# Patient Record
Sex: Female | Born: 1990 | Hispanic: Yes | Smoking: Never smoker
Health system: Southern US, Community
[De-identification: ages and names within clinical notes are randomized; demographics above are authoritative.]

## PROBLEM LIST (undated history)

## (undated) DIAGNOSIS — D649 Anemia, unspecified: Secondary | ICD-10-CM

---
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
## 2017-03-21 ENCOUNTER — Other Ambulatory Visit: Payer: Self-pay | Admitting: Occupational Medicine

## 2017-03-21 ENCOUNTER — Ambulatory Visit: Payer: Self-pay

## 2017-03-21 DIAGNOSIS — M25511 Pain in right shoulder: Secondary | ICD-10-CM

## 2017-04-07 ENCOUNTER — Other Ambulatory Visit: Payer: Self-pay | Admitting: Orthopedic Surgery

## 2017-04-07 DIAGNOSIS — S4381XS Sprain of other specified parts of right shoulder girdle, sequela: Secondary | ICD-10-CM

## 2017-04-07 DIAGNOSIS — M25512 Pain in left shoulder: Secondary | ICD-10-CM

## 2017-04-20 ENCOUNTER — Ambulatory Visit
Admission: RE | Admit: 2017-04-20 | Discharge: 2017-04-20 | Disposition: A | Discharge status: Home / Self Care | Payer: PRIVATE HEALTH INSURANCE | Source: Ambulatory Visit | Attending: Orthopedic Surgery | Admitting: Orthopedic Surgery

## 2017-04-20 DIAGNOSIS — S4381XA Sprain of other specified parts of right shoulder girdle, initial encounter: Secondary | ICD-10-CM | POA: Insufficient documentation

## 2017-04-20 DIAGNOSIS — S4381XS Sprain of other specified parts of right shoulder girdle, sequela: Secondary | ICD-10-CM

## 2017-04-20 DIAGNOSIS — M25512 Pain in left shoulder: Secondary | ICD-10-CM

## 2017-04-20 DIAGNOSIS — M25511 Pain in right shoulder: Secondary | ICD-10-CM | POA: Diagnosis not present

## 2017-05-10 ENCOUNTER — Ambulatory Visit: Payer: PRIVATE HEALTH INSURANCE

## 2017-05-26 ENCOUNTER — Ambulatory Visit: Payer: PRIVATE HEALTH INSURANCE | Admitting: Physical Therapy

## 2017-05-30 ENCOUNTER — Ambulatory Visit: Payer: PRIVATE HEALTH INSURANCE | Admitting: Physical Therapy

## 2017-05-31 ENCOUNTER — Encounter: Payer: Self-pay | Admitting: Physical Therapy

## 2017-06-06 ENCOUNTER — Encounter: Payer: PRIVATE HEALTH INSURANCE | Admitting: Physical Therapy

## 2017-06-06 ENCOUNTER — Ambulatory Visit: Payer: PRIVATE HEALTH INSURANCE | Attending: Orthopedic Surgery | Admitting: Physical Therapy

## 2017-06-06 DIAGNOSIS — M6281 Muscle weakness (generalized): Secondary | ICD-10-CM | POA: Insufficient documentation

## 2017-06-06 DIAGNOSIS — M25511 Pain in right shoulder: Secondary | ICD-10-CM | POA: Insufficient documentation

## 2017-06-06 DIAGNOSIS — G8929 Other chronic pain: Secondary | ICD-10-CM | POA: Insufficient documentation

## 2017-06-06 NOTE — Therapy (Signed)
[facility] [address] [place], [address] Phone: [phone]   Fax:  [phone]  Physical Therapy Evaluation  Patient Details  Name: Bonnie Williams MRN: [id number] Date of Birth: 1991-01-19 Referring Provider: Dr. Gean Birchwood  Encounter Date: 06/06/2017      PT End of Session - 06/06/17 0901    Visit Number 1   Number of Visits 9   Date for PT Re-Evaluation 07/18/17   PT Start Time 0809   PT Stop Time 0859   PT Time Calculation (min) 50 min   Activity Tolerance Patient tolerated treatment well   Behavior During Therapy [facility] for tasks assessed/performed      No past medical history on file.  No past surgical history on file.  There were no vitals filed for this visit.       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 She has had imaging done which she reports shows no tears but significant swelling. Initially she heard 2 separate 2 pops, felt a warming sensation in her R upper trap area, felt an increasing numbing sensation throughout her R arm. She was in a sling for a few weeks, which she reports if anything made it worse. Right now she can lift her arm to write on the white boards once or twice without pain, however with repetition she reports feeling more pain. She has not tried mobilizing a patient since, as she has hesitancy. Most of her discomfort now is with repetitive overhead movements, and upper trap area in the night.    Limitations Lifting  Pulling, sleeping   Diagnostic tests MRI - mild tendinosis of the supraspinatus    Patient Stated Goals Get as strong as possible with as minimal pain as possible.    Currently in Pain? No/denies            [facility] PT Assessment -  06/06/17 1434      Assessment   Medical Diagnosis R shoulder pain   Referring Provider Dr. Gean Birchwood     Precautions   Precautions None     Restrictions   Weight Bearing Restrictions No     Balance Screen   Has the patient fallen in the past 6 months No     Prior Function   Level of Independence Independent     Cognition   Overall Cognitive Status Within Functional Limits for tasks assessed      R grip strength in pounds  48 53 45  L grip strength in pounds  45 60 40#  R flexion 136 degrees (discomfort)  Abduction - 145 degrees (discomfort)  L flexion - 165 degrees  Abduction - 171 degrees   R rotation very mildly limited and reproductive of her pain in R upper trap  Cervical retraction, extension, flexion, side bending, L rotation all WNL and pain free   R passive ER limited, guarding and painful IR - WNL , passive flexion guarding full motion but guarded   MMT  4-/5 in IR, ER, abdcution secondary to pain on R, 5/5 on L  Traction - painful per patient  Grade I-II mobilizations performed over thoracic into cervical spine. Her areas of most discomfort were roughly T5-7 through T1, decreasing throughout the cervical  spine. She reports feeling of stiffness and heaviness while completing. 3 bouts x 30" performed throughout the thoracic spine with feeling of stiffness/achiness reported.   Soft tissue mobilization performed over R rhomboids and paraspinals into lower trapezius as well. Patient reported feeling less stiffness around this spot to complete session. Performed follow up gradual passive ER through tight sensation range but not pain x 15" x 3 bouts.   After tx - 145 degrees actively with less pain reported.        Objective measurements completed on examination: See above findings.                  PT Education - 06/06/17 0900    Education provided Yes   Education Details Her discomfort appears to be related to residual soft  tissue/joint related dysfunction. Will progress to strengthening activities when her pain levels decrease.    Person(s) Educated Patient   Methods Explanation;Demonstration   Comprehension Verbalized understanding;Returned demonstration             PT Long Term Goals - 06/06/17 1302      PT LONG TERM GOAL #1   Title Patient will demonstrate at least 160 degrees of active shoulder flexion with no increase in pain to return to work related duties.    Time 6   Period Weeks   Status New     PT LONG TERM GOAL #2   Title Patient will report QuickDash score of less than 10% disability to demonstrate improved tolerance for ADLs.    Time 6   Period Weeks   Status New     PT LONG TERM GOAL #3   Title Patient will complete 1 week of full work duties with no increase in pain to complete ADLs.    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 Imaging revealed no tears of the RTC and she is able to actively contract all RTC muscles, though painful. Her active and passive ROM are limited by pain currently, which appears to at least in part be contributed by soft tissue dysfunction. She also notes pain with R rotation and traction indicative of possible cervical involvement, as pain is largely centered around UT and biceps head interval in her shoulder. She would benefit from skilled PT services to address her pain and reduced function for full return to daily activities.    Clinical Presentation Stable   Clinical Decision Making Moderate   Rehab Potential Excellent   Clinical Impairments Affecting Rehab Potential Young age, motivated, good ROM and no imaging findings of tears.    PT Frequency 2x / week   PT Duration 4 weeks   PT Treatment/Interventions Aquatic Therapy;ADLs/Self Care Home Management;Electrical  Stimulation;Dry needling;Manual techniques;Neuromuscular re-education;Therapeutic exercise;Therapeutic activities;Balance training;Taping;Moist Heat;Traction;Iontophoresis 4mg /ml Dexamethasone;Cryotherapy   PT Next Visit Plan Progress soft tissue techniques for pain relief.    PT Home Exercise Plan Will provide in follow up session.    Consulted and Agree with Plan of Care Patient      Patient will benefit from skilled therapeutic intervention in order to improve the following deficits and impairments:     Visit Diagnosis: Chronic right shoulder pain - Plan: PT plan of care cert/re-cert  Muscle weakness (generalized) -  Plan: PT plan of care cert/re-cert     Problem List There are no active problems to display for this patient.  Alva GarnetPatrick Kowen Kluth PT, DPT, CSCS    06/06/2017, 2:36 PM  [facility] [address] [place], [place], [address] Phone: [phone]   Fax:  [phone]  Name: Ronnald Collumvelia Martinez Benitez MRN: [id number] Date of Birth: [birth year]

## 2017-06-06 NOTE — Patient Instructions (Addendum)
R 48 53 45  L 45 60 40#  R flexion 136 degrees  Abduction - 145 degrees (discomfort)  L flexion - 165 degrees  Abduction - 171 degrees   R rotation very mildly limited and reproductive of her pain in R upper trap  Cervical retraction, extension, flexion, side bending, L rotation all WNL and pain free   R passive ER limited, guarding and painful IR - WNL , passive flexion guarding full motion but guarded   MMT  4-/5 in IR, ER, abdcution secondary to pain on R, 5/5 on L  Traction - painful per patient    After tx - 145 degrees actively with less

## 2017-06-09 ENCOUNTER — Ambulatory Visit: Payer: PRIVATE HEALTH INSURANCE | Admitting: Physical Therapy

## 2017-06-09 DIAGNOSIS — M6281 Muscle weakness (generalized): Secondary | ICD-10-CM

## 2017-06-09 DIAGNOSIS — G8929 Other chronic pain: Secondary | ICD-10-CM

## 2017-06-09 DIAGNOSIS — M25511 Pain in right shoulder: Principal | ICD-10-CM

## 2017-06-15 ENCOUNTER — Ambulatory Visit: Payer: PRIVATE HEALTH INSURANCE | Admitting: Physical Therapy

## 2017-06-15 DIAGNOSIS — G8929 Other chronic pain: Secondary | ICD-10-CM

## 2017-06-15 DIAGNOSIS — M25511 Pain in right shoulder: Principal | ICD-10-CM

## 2017-06-15 DIAGNOSIS — M6281 Muscle weakness (generalized): Secondary | ICD-10-CM

## 2017-06-15 NOTE — Patient Instructions (Signed)
AROM to 160 degrees

## 2017-06-15 NOTE — Therapy (Deleted)
[facility] [address], [place], [address] Phone: [phone]   Fax:  [phone]  Physical Therapy Evaluation  Patient Details  Name: Bonnie Williams MRN: [id number] Date of Birth: 1991-10-19 Referring Provider: Dr. Gean Birchwood  Encounter Date: 06/09/2017      PT End of Session - 06/15/17 1249    Visit Number 2   Number of Visits 9   Date for PT Re-Evaluation 07/18/17   PT Start Time 1745   PT Stop Time 1830   PT Time Calculation (min) 45 min   Activity Tolerance Patient tolerated treatment well   Behavior During Therapy [facility] for tasks assessed/performed      No past medical history on file.  No past surgical history on file.  There were no vitals filed for this visit.       Subjective Assessment - 06/15/17 1248    Subjective Patient reports she has continued to have some pain in her shoulder and neck area, but that this has reduced after brief increase after manual therapy performed in evaluation.    Limitations Lifting  Pulling, sleeping   Diagnostic tests MRI - mild tendinosis of the supraspinatus    Patient Stated Goals Get as strong as possible with as minimal pain as possible.    Currently in Pain? Other (Comment)  Soreness with overhead motion or moving her arm, but less so than her baseline.                 Objective measurements completed on examination: See above findings.                  PT Education - 06/15/17 1249    Education provided Yes   Education Details Will continue to focus on pain control then work on ROM and strength for return to function.    Person(s) Educated Patient   Methods Demonstration;Explanation   Comprehension Verbalized understanding;Returned demonstration             PT Long Term Goals - 06/06/17 1302      PT LONG TERM GOAL #1   Title Patient will demonstrate at least 160 degrees of active shoulder flexion with  no increase in pain to return to work related duties.    Time 6   Period Weeks   Status New     PT LONG TERM GOAL #2   Title Patient will report QuickDash score of less than 10% disability to demonstrate improved tolerance for ADLs.    Time 6   Period Weeks   Status New     PT LONG TERM GOAL #3   Title Patient will complete 1 week of full work duties with no increase in pain to complete ADLs.    Time 6   Period Weeks   Status New                Plan - 06/15/17 1249    Clinical Impression Statement Patient is initially quite sore with soft tissue muscle pains, but appeared to find relief and increased her AROM without as much discomfort after soft tissue pain control modalities applied. WIll begin strengthening and ROM phase as tolerated when her resting and active motion pain are decreased.    Clinical Presentation Stable   Clinical Decision Making Moderate   Rehab Potential Excellent   Clinical Impairments Affecting Rehab Potential Young age, motivated, good ROM and no imaging findings of tears.    PT  Frequency 2x / week   PT Duration 4 weeks   PT Treatment/Interventions Aquatic Therapy;ADLs/Self Care Home Management;Electrical Stimulation;Dry needling;Manual techniques;Neuromuscular re-education;Therapeutic exercise;Therapeutic activities;Balance training;Taping;Moist Heat;Traction;Iontophoresis 4mg /ml Dexamethasone;Cryotherapy   PT Next Visit Plan Progress soft tissue techniques for pain relief.    PT Home Exercise Plan Will provide in follow up session.    Consulted and Agree with Plan of Care Patient      Patient will benefit from skilled therapeutic intervention in order to improve the following deficits and impairments:  Pain, Impaired UE functional use, Decreased strength, Decreased range of motion  Visit Diagnosis: Chronic right shoulder pain  Muscle weakness (generalized)     Problem List There are no active problems to display for this  patient.   Alva Garnetatrick McNamara 06/15/2017, 1:01 PM  [place] [facility] [address] [place], [place], [address] Phone: [phone]   Fax:  [phone]  Name: Bonnie Williams MRN: [id number] Date of Birth: 11/15/1991

## 2017-06-15 NOTE — Therapy (Signed)
[facility] [address] [place], [place], [address] Phone: [phone]   Fax:  [phone]  Physical Therapy Treatment  Patient Details  Name: Bonnie Collumvelia Martinez Williams MRN: [id number] Date of Birth: 27-Aug-1991 Referring Provider: Dr. Gean BirchwoodFrank Rowan  Encounter Date: 06/09/2017      PT End of Session - 06/15/17 1249    Visit Number 2   Number of Visits 9   Date for PT Re-Evaluation 07/18/17   PT Start Time 1745   PT Stop Time 1830   PT Time Calculation (min) 45 min   Activity Tolerance Patient tolerated treatment well   Behavior During Therapy [facility] for tasks assessed/performed      No past medical history on file.  No past surgical history on file.  There were no vitals filed for this visit.      Subjective Assessment - 06/15/17 1248    Subjective Patient reports she has continued to have some pain in her shoulder and neck area, but that this has reduced after brief increase after manual therapy performed in evaluation.    Limitations Lifting  Pulling, sleeping   Diagnostic tests MRI - mild tendinosis of the supraspinatus    Patient Stated Goals Get as strong as possible with as minimal pain as possible.    Currently in Pain? Other (Comment)  Soreness with overhead motion or moving her arm, but less so than her baseline.       Hot pack and high volt stim applied to infraspinatus and upper trapezius on her R side (75V x 15 minutes) with reported decrease in tightness and discomfort she was feeling   Soft tissue mobilization around infraspinatus, rhomboids, levator scapulae, and upper trapezius, patient was tender in all areas, especially levator scapulae and upper trapezius likely due to compensatory movement patterns to avoid use of supraspinatus. Patient was able to complete forward elevation to roughly 145-150 degrees with less pain noted during elevation than when performed initially in this session.                             PT Education - 06/15/17 1249    Education provided Yes   Education Details Will continue to focus on pain control then work on ROM and strength for return to function.    Person(s) Educated Patient   Methods Demonstration;Explanation   Comprehension Verbalized understanding;Returned demonstration             PT Long Term Goals - 06/06/17 1302      PT LONG TERM GOAL #1   Title Patient will demonstrate at least 160 degrees of active shoulder flexion with no increase in pain to return to work related duties.    Time 6   Period Weeks   Status New     PT LONG TERM GOAL #2   Title Patient will report QuickDash score of less than 10% disability to demonstrate improved tolerance for ADLs.    Time 6   Period Weeks   Status New     PT LONG TERM GOAL #3   Title Patient will complete 1 week of full work duties with no increase in pain to complete ADLs.    Time 6   Period Weeks   Status New               Plan - 06/15/17 1249    Clinical Impression Statement Patient is initially quite sore with  soft tissue muscle pains, but appeared to find relief and increased her AROM without as much discomfort after soft tissue pain control modalities applied. WIll begin strengthening and ROM phase as tolerated when her resting and active motion pain are decreased.    Clinical Presentation Stable   Clinical Decision Making Moderate   Rehab Potential Excellent   Clinical Impairments Affecting Rehab Potential Young age, motivated, good ROM and no imaging findings of tears.    PT Frequency 2x / week   PT Duration 4 weeks   PT Treatment/Interventions Aquatic Therapy;ADLs/Self Care Home Management;Electrical Stimulation;Dry needling;Manual techniques;Neuromuscular re-education;Therapeutic exercise;Therapeutic activities;Balance training;Taping;Moist Heat;Traction;Iontophoresis 4mg /ml Dexamethasone;Cryotherapy   PT Next Visit Plan Progress soft  tissue techniques for pain relief.    PT Home Exercise Plan Will provide in follow up session.    Consulted and Agree with Plan of Care Patient      Patient will benefit from skilled therapeutic intervention in order to improve the following deficits and impairments:  Pain, Impaired UE functional use, Decreased strength, Decreased range of motion  Visit Diagnosis: Chronic right shoulder pain  Muscle weakness (generalized)     Problem List There are no active problems to display for this patient.  Alva Garnet PT, DPT, CSCS    06/15/2017, 1:01 PM  [place] [place] [facility] PHYSICAL AND SPORTS MEDICINE [address], [place], [address] Phone: [phone]   Fax:  [phone]  Name: Bonnie Williams MRN: [id number] Date of Birth: 10-08-1991

## 2017-06-16 ENCOUNTER — Ambulatory Visit: Payer: PRIVATE HEALTH INSURANCE | Admitting: Physical Therapy

## 2017-06-16 DIAGNOSIS — M25511 Pain in right shoulder: Secondary | ICD-10-CM | POA: Diagnosis not present

## 2017-06-16 DIAGNOSIS — G8929 Other chronic pain: Secondary | ICD-10-CM

## 2017-06-16 DIAGNOSIS — M6281 Muscle weakness (generalized): Secondary | ICD-10-CM

## 2017-06-16 NOTE — Therapy (Signed)
[place] [facility] [address] [place], [place], [address] Phone: [phone]   Fax:  [phone]  Physical Therapy Treatment  Patient Details  Name: Ronnald Collumvelia Martinez Benitez MRN: [id number] Date of Birth: [birth year] Referring Provider: Dr. Gean BirchwoodFrank Rowan  Encounter Date: 06/15/2017      PT End of Session - 06/16/17 1230    Visit Number 3   Number of Visits 9   Date for PT Re-Evaluation 07/18/17   PT Start Time 1530   PT Stop Time 1600   PT Time Calculation (min) 30 min   Activity Tolerance Patient tolerated treatment well   Behavior During Therapy [facility] for tasks assessed/performed      No past medical history on file.  No past surgical history on file.  There were no vitals filed for this visit.      Subjective Assessment - 06/15/17 1534    Subjective Patient reports her pain levels are much decreased from previous visits, though she gets more pain after working several shifts in a row.    Limitations Lifting  Pulling, sleeping   Diagnostic tests MRI - mild tendinosis of the supraspinatus    Patient Stated Goals Get as strong as possible with as minimal pain as possible.    Currently in Pain? Other (Comment)  Just soreness with overhead motion      AROM to 160 degrees   Sidelying ER 1# for 12 repetitions for 3 sets with towel roll between rib cage and elbow   Prone horizontal abduction with 1# x 8 repetitions for 3 sets   Scaption with 1# DB x 7 repetitions for 2 sets   Performed soft tissue mobilization over levator scapulae and upper trapezius where she was indicating residual soreness from over the weekend. She reports decrease in pain to complete session.                            PT Education - 06/15/17 1534    Education provided Yes   Education Details Will begin strengthening exercises as her motion and pain are normalizing    Person(s) Educated Patient   Methods  Explanation;Demonstration;Handout   Comprehension Verbalized understanding;Returned demonstration             PT Long Term Goals - 06/06/17 1302      PT LONG TERM GOAL #1   Title Patient will demonstrate at least 160 degrees of active shoulder flexion with no increase in pain to return to work related duties.    Time 6   Period Weeks   Status New     PT LONG TERM GOAL #2   Title Patient will report QuickDash score of less than 10% disability to demonstrate improved tolerance for ADLs.    Time 6   Period Weeks   Status New     PT LONG TERM GOAL #3   Title Patient will complete 1 week of full work duties with no increase in pain to complete ADLs.    Time 6   Period Weeks   Status New               Plan - 06/16/17 1231    Clinical Impression Statement Patient has made excellent progress with AROM and resting pain levels. She still has some altered kinematics with OH motion leading to residual discomfort in the UT, however this will be addressed with targeted strengthening.    Clinical Presentation  Stable   Clinical Decision Making Low   Rehab Potential Excellent   Clinical Impairments Affecting Rehab Potential Young age, motivated, good ROM and no imaging findings of tears.    PT Frequency 2x / week   PT Duration 4 weeks   PT Treatment/Interventions Aquatic Therapy;ADLs/Self Care Home Management;Electrical Stimulation;Dry needling;Manual techniques;Neuromuscular re-education;Therapeutic exercise;Therapeutic activities;Balance training;Taping;Moist Heat;Traction;Iontophoresis 4mg /ml Dexamethasone;Cryotherapy   PT Next Visit Plan Progress soft tissue techniques for pain relief.    PT Home Exercise Plan Will provide in follow up session.    Consulted and Agree with Plan of Care Patient      Patient will benefit from skilled therapeutic intervention in order to improve the following deficits and impairments:  Pain, Impaired UE functional use, Decreased strength,  Decreased range of motion  Visit Diagnosis: Chronic right shoulder pain  Muscle weakness (generalized)     Problem List There are no active problems to display for this patient.  Alva Garnet PT, DPT, CSCS    06/16/2017, 12:32 PM  [place] [facility] SP[facility]TS MEDICINE [address], [place], [address] Phone: [phone]   Fax:  [phone]  Name: Vera Furniss MRN: [id number] Date of Birth: 11/19/1991

## 2017-06-16 NOTE — Therapy (Signed)
[facility] [address], [place], [address] Phone: [phone]   Fax:  [phone]  Physical Therapy Treatment  Patient Details  Name: Bonnie Williams MRN: [id number] Date of Birth: [birth year] Referring Provider: Dr. Frederik Pear  Encounter Date: 06/16/2017      PT End of Session - 06/16/17 1450    Visit Number 4   Number of Visits 9   Date for PT Re-Evaluation 07/18/17   PT Start Time 2947   PT Stop Time 1440   PT Time Calculation (min) 20 min   Activity Tolerance Patient tolerated treatment well   Behavior During Therapy [facility] for tasks assessed/performed      No past medical history on file.  No past surgical history on file.  There were no vitals filed for this visit.      Subjective Assessment - 06/16/17 1424    Subjective Patient reports she is feeling much better and aside from some soreness after swimming the other day.    Limitations Lifting  Pulling, sleeping   Diagnostic tests MRI - mild tendinosis of the supraspinatus    Patient Stated Goals Get as strong as possible with as minimal pain as possible.    Currently in Pain? No/denies      Low rows with green t-band x 15 repetitions with appropriate technique   Prone horizontal abduction with 1# DB -- able to complete set up and technique properly x 8 repetitions   Bilateral ERs with red t-band (added towel roll underneath her armpit) -- initially struggled to keep elbow at her side, with towel roll able to complete properly x 12 repetitions   Bear hugs with green t-band x 15 reptitions -- appropriate technique                            PT Education - 06/16/17 1450    Education provided Yes   Education Details provided HEP and discharge instructions    Person(s) Educated Patient   Methods Explanation;Demonstration;Handout   Comprehension Returned demonstration;Verbalized understanding              PT Long Term Goals - 06/16/17 1452      PT LONG TERM GOAL #1   Title Patient will demonstrate at least 160 degrees of active shoulder flexion with no increase in pain to return to work related duties.    Time 6   Period Weeks   Status Achieved     PT LONG TERM GOAL #2   Title Patient will report QuickDash score of less than 10% disability to demonstrate improved tolerance for ADLs.    Time 6   Period Weeks   Status On-going     PT LONG TERM GOAL #3   Title Patient will complete 1 week of full work duties with no increase in pain to complete ADLs.    Time 6   Period Weeks   Status Partially Met               Plan - 06/16/17 1450    Clinical Impression Statement Patient has no further complaints, she was provided with an HEP to address typical areas of weakness/deficits associated with shoulder pain. She will be discharged from formal PT services.    Clinical Presentation Stable   Clinical Decision Making Low   Rehab Potential Excellent   Clinical Impairments Affecting Rehab Potential Young age, motivated, good ROM and no  imaging findings of tears.    PT Frequency 2x / week   PT Duration 4 weeks   PT Treatment/Interventions Aquatic Therapy;ADLs/Self Care Home Management;Electrical Stimulation;Dry needling;Manual techniques;Neuromuscular re-education;Therapeutic exercise;Therapeutic activities;Balance training;Taping;Moist Heat;Traction;Iontophoresis 93m/ml Dexamethasone;Cryotherapy   PT Next Visit Plan Progress soft tissue techniques for pain relief.    PT Home Exercise Plan Will provide in follow up session.    Consulted and Agree with Plan of Care Patient      Patient will benefit from skilled therapeutic intervention in order to improve the following deficits and impairments:  Pain, Impaired UE functional use, Decreased strength, Decreased range of motion  Visit Diagnosis: Chronic right shoulder pain  Muscle weakness (generalized)     Problem  List There are no active problems to display for this patient.  PRoyce MacadamiaPT, DPT, CSCS    06/16/2017, 2:52 PM  [facility] [address] [place] [address]Phone: [phone]  Fax:  [phone] Name: EZanasia HicksonMRN: [id number] of Birth: [birth year]

## 2017-06-23 ENCOUNTER — Encounter: Payer: PRIVATE HEALTH INSURANCE | Admitting: Physical Therapy

## 2019-12-07 NOTE — L&D Delivery Note (Signed)
Date of delivery: 10/25/2020 Estimated Date of Delivery: 11/11/20 Patient's last menstrual period was 02/05/2020. EGA: [redacted]w[redacted]d  Delivery Note At 12:12 AM a viable female was delivered via Vaginal, Spontaneous (Presentation: Left Occiput Anterior).  APGAR: 8, 9; weight pending skin to skin.   Placenta status: Spontaneous, Intact.  Cord: 3 vessels with the following complications: None.  Cord pH: not collected  Anesthesia: Epidural Episiotomy: None Lacerations: 1st degree Suture Repair: 3.0 vicryl rapide Est. Blood Loss (mL): 670cc  Mom presented to L&D with SROM, augmented with pitocin and AROM of forebag.  epidual placed. Progressed to complete, second stage: <10 minutes. Delivery of fetal head with restitution to LOT.   Anterior then posterior shoulders delivered without difficulty.  Baby placed on mom's chest, and attended to by peds.   Cord was then clamped and cut when pulseless by FOB.  Placenta spontaneously delivered, intact.   IV pitocin given for hemorrhage prophylaxis. Thin, brisk bleeding in bursts, methergine IM given.  Shallow 1st degree repaired with 3-0 vicryl rapide in typical fashion.   We sang happy birthday to baby [place].   Mom to postpartum.  Baby to Couplet care / Skin to Skin.  Vantasia Pinkney C Robel Wuertz 10/25/2020, 12:42 AM

## 2020-04-07 ENCOUNTER — Other Ambulatory Visit: Payer: Self-pay

## 2020-04-07 ENCOUNTER — Emergency Department: Payer: Medicaid Other

## 2020-04-07 ENCOUNTER — Encounter: Payer: Self-pay | Admitting: Emergency Medicine

## 2020-04-07 ENCOUNTER — Emergency Department
Admission: EM | Admit: 2020-04-07 | Discharge: 2020-04-07 | Disposition: A | Discharge status: Home / Self Care | Payer: Medicaid Other | Attending: Student in an Organized Health Care Education/Training Program | Admitting: Student in an Organized Health Care Education/Training Program

## 2020-04-07 DIAGNOSIS — R112 Nausea with vomiting, unspecified: Secondary | ICD-10-CM

## 2020-04-07 DIAGNOSIS — O209 Hemorrhage in early pregnancy, unspecified: Secondary | ICD-10-CM | POA: Insufficient documentation

## 2020-04-07 DIAGNOSIS — Z3491 Encounter for supervision of normal pregnancy, unspecified, first trimester: Secondary | ICD-10-CM | POA: Insufficient documentation

## 2020-04-07 DIAGNOSIS — Z3A09 9 weeks gestation of pregnancy: Secondary | ICD-10-CM | POA: Diagnosis not present

## 2020-04-07 DIAGNOSIS — O219 Vomiting of pregnancy, unspecified: Secondary | ICD-10-CM | POA: Diagnosis present

## 2020-04-07 HISTORY — DX: Anemia, unspecified: D64.9

## 2020-04-07 LAB — BASIC METABOLIC PANEL
Anion gap: 10 (ref 5–15)
BUN: 8 mg/dL (ref 6–20)
CO2: 25 mmol/L (ref 22–32)
Calcium: 9.2 mg/dL (ref 8.9–10.3)
Chloride: 100 mmol/L (ref 98–111)
Creatinine, Ser: 0.41 mg/dL — ABNORMAL LOW (ref 0.44–1.00)
GFR calc Af Amer: >60 mL/min (ref >60)
GFR calc non Af Amer: >60 mL/min (ref >60)
Glucose, Bld: 95 mg/dL (ref 70–99)
Potassium: 3.3 mmol/L — ABNORMAL LOW (ref 3.5–5.1)
Sodium: 135 mmol/L (ref 135–145)

## 2020-04-07 LAB — CBC
HCT: 39.5 % (ref 36.0–46.0)
Hemoglobin: 13.6 g/dL (ref 12.0–15.0)
MCH: 29.3 pg (ref 26.0–34.0)
MCHC: 34.4 g/dL (ref 30.0–36.0)
MCV: 85.1 fL (ref 80.0–100.0)
Platelets: 333 10*3/uL (ref 150–400)
RBC: 4.64 MIL/uL (ref 3.87–5.11)
RDW: 11.7 % (ref 11.5–15.5)
WBC: 11.1 10*3/uL — ABNORMAL HIGH (ref 4.0–10.5)
nRBC: 0 % (ref 0.0–0.2)

## 2020-04-07 LAB — HEPATIC FUNCTION PANEL
ALT: 21 U/L (ref 0–44)
AST: 19 U/L (ref 15–41)
Albumin: 4.3 g/dL (ref 3.5–5.0)
Alkaline Phosphatase: 65 U/L (ref 38–126)
Bilirubin, Direct: <0.1 mg/dL (ref 0.0–0.2)
Total Bilirubin: 0.5 mg/dL (ref 0.3–1.2)
Total Protein: 8.3 g/dL — ABNORMAL HIGH (ref 6.5–8.1)

## 2020-04-07 LAB — HCG, QUANTITATIVE, PREGNANCY: hCG, Beta Chain, Quant, S: 199257 m[IU]/mL — ABNORMAL HIGH (ref <5)

## 2020-04-07 LAB — LIPASE, BLOOD: Lipase: 23 U/L (ref 11–51)

## 2020-04-07 MED ORDER — SODIUM CHLORIDE 0.9 % IV BOLUS
1000.0000 mL | Freq: Once | INTRAVENOUS | Status: AC
  Administered 2020-04-07: 14:00:00 1000 mL via INTRAVENOUS

## 2020-04-07 MED ORDER — ONDANSETRON HCL 4 MG/2ML IJ SOLN
4.0000 mg | Freq: Once | INTRAMUSCULAR | Status: AC
  Administered 2020-04-07: 14:00:00 4 mg via INTRAVENOUS
  Filled 2020-04-07: qty 2

## 2020-04-07 MED ORDER — METOCLOPRAMIDE HCL 10 MG PO TABS: 10.0000 mg | ORAL_TABLET | Freq: Four times a day (QID) | ORAL | 0 refills | Status: DC | PRN

## 2020-04-07 MED ORDER — PROMETHAZINE HCL 25 MG/ML IJ SOLN: 25.0000 mg | Freq: Four times a day (QID) | INTRAMUSCULAR | Status: DC | PRN

## 2020-04-07 MED ORDER — METOCLOPRAMIDE HCL 5 MG/ML IJ SOLN
10.0000 mg | Freq: Once | INTRAMUSCULAR | Status: AC
  Administered 2020-04-07: 15:00:00 10 mg via INTRAVENOUS
  Filled 2020-04-07: qty 2

## 2020-04-07 NOTE — ED Notes (Signed)
Rainbow sent to the lab. Pt unable to urinate at this time, given specimen cup for when is able to do so.

## 2020-04-07 NOTE — Discharge Instructions (Addendum)
CLINICAL DATA:  Vaginal bleeding.     EXAM:  OBSTETRIC <14 WK [place] AND TRANSVAGINAL OB US     TECHNIQUE:  Both transabdominal and transvaginal ultrasound examinations were  performed for complete evaluation of the gestation as well as the  maternal uterus, adnexal regions, and pelvic cul-de-sac.  Transvaginal technique was performed to assess early pregnancy.     COMPARISON:  None.     FINDINGS:  Intrauterine gestational sac: Single     Yolk sac:  Visualized.     Embryo:  Visualized.     Cardiac Activity: Visualized.     Heart Rate: 176 bpm     CRL: 24 mm   9 w   1 d                  [place] EDC: 11/09/2020     Subchorionic hemorrhage: There is a small volume of subchorionic  hemorrhage.     Maternal uterus/adnexae: There is a probable 1.4 cm corpus luteal  cyst involving the right ovary. The left ovary is unremarkable.  There is no significant free fluid in the patient's pelvis.     IMPRESSION:  Single live IUP at 9 weeks and 1 day. There is a small volume of  subchorionic hemorrhage.        Electronically Signed    By: Katherine Mantle M.D.    On: 04/07/2020 16:01

## 2020-04-07 NOTE — ED Triage Notes (Signed)
Pt here with c/o vomiting, states she is [redacted] weeks pregnant, is losing weight and dehydrated. Was placed on nausea medicine phenergan but pt states it hasn't helped. No active vomiting in triage. NAD.

## 2020-04-07 NOTE — ED Provider Notes (Signed)
[facility] Emergency Department Provider Note    First MD Initiated Contact with Patient 04/07/20 1345     (approximate)  I have reviewed the triage vital signs and the nursing notes.   HISTORY  Chief Complaint Emesis During Pregnancy    HPI Bonnie Williams is a 29 y.o. female G2, P1 presents to the ER for evaluation of intractable nausea and vomiting and concern of dehydration.  Patient struggled with morning sickness during previous pregnancy.  Was just recently seen by Dr. Letta Pate of OB/GYN control history.  Had reportedly reassuring ultrasound but since then has been having cramping pain and vaginal spotting.  Primarily left-sided discomfort.  States she was given prescription for Phenergan but does not feel that that is helping.  She still having persistent nausea and vomiting.    Past Medical History:  Diagnosis Date  . Anemia    No family history on file. History reviewed. No pertinent surgical history. There are no problems to display for this patient.     Prior to Admission medications   Medication Sig Start Date End Date Taking? Authorizing Provider  metoCLOPramide (REGLAN) 10 MG tablet Take 1 tablet (10 mg total) by mouth every 6 (six) hours as needed. 04/07/20 04/07/21  Willy Eddy, MD    Allergies Patient has no known allergies.    Social History Social History   Tobacco Use  . Smoking status: Never Smoker  . Smokeless tobacco: Never Used  Substance Use Topics  . Alcohol use: Not Currently  . Drug use: Not on file    Review of Systems Patient denies headaches, rhinorrhea, blurry vision, numbness, shortness of breath, chest pain, edema, cough, abdominal pain, nausea, vomiting, diarrhea, dysuria, fevers, rashes or hallucinations unless otherwise stated above in HPI. ____________________________________________   PHYSICAL EXAM:  VITAL SIGNS: Vitals:   04/07/20 1316  BP: 133/78  Pulse: 85  Resp: 18  Temp:  98.3 F (36.8 C)  SpO2: 98%    Constitutional: Alert and oriented.  Eyes: Conjunctivae are normal.  Head: Atraumatic. Nose: No congestion/rhinnorhea. Mouth/Throat: Mucous membranes are moist.   Neck: No stridor. Painless ROM.  Cardiovascular: Normal rate, regular rhythm. Grossly normal heart sounds.  Good peripheral circulation. Respiratory: Normal respiratory effort.  No retractions. Lungs CTAB. Gastrointestinal: Soft, gravid, no guarding or rebound. No distention. No abdominal bruits. No CVA tenderness. Genitourinary:  Musculoskeletal: No lower extremity tenderness nor edema.  No joint effusions. Neurologic:  Normal speech and language. No gross focal neurologic deficits are appreciated. No facial droop Skin:  Skin is warm, dry and intact. No rash noted. Psychiatric: Mood and affect are normal. Speech and behavior are normal.  ____________________________________________   LABS (all labs ordered are listed, but only abnormal results are displayed)  Results for orders placed or performed during the hospital encounter of 04/07/20 (from the past 24 hour(s))  CBC     Status: Abnormal   Collection Time: 04/07/20  1:18 PM  Result Value Ref Range   WBC 11.1 (H) 4.0 - 10.5 K/uL   RBC 4.64 3.87 - 5.11 MIL/uL   Hemoglobin 13.6 12.0 - 15.0 g/dL   HCT 54.0 08.6 - 76.1 %   MCV 85.1 80.0 - 100.0 fL   MCH 29.3 26.0 - 34.0 pg   MCHC 34.4 30.0 - 36.0 g/dL   RDW 95.0 93.2 - 67.1 %   Platelets 333 150 - 400 K/uL   nRBC 0.0 0.0 - 0.2 %  Basic metabolic panel  Status: Abnormal   Collection Time: 04/07/20  1:18 PM  Result Value Ref Range   Sodium 135 135 - 145 mmol/L   Potassium 3.3 (L) 3.5 - 5.1 mmol/L   Chloride 100 98 - 111 mmol/L   CO2 25 22 - 32 mmol/L   Glucose, Bld 95 70 - 99 mg/dL   BUN 8 6 - 20 mg/dL   Creatinine, Ser 3.26 (L) 0.44 - 1.00 mg/dL   Calcium 9.2 8.9 - 71.2 mg/dL   GFR calc non Af Amer >60 >60 mL/min   GFR calc Af Amer >60 >60 mL/min   Anion gap 10 5 - 15    hCG, quantitative, pregnancy     Status: Abnormal   Collection Time: 04/07/20  1:18 PM  Result Value Ref Range   hCG, Beta Chain, Quant, S 199,257 (H) <5 mIU/mL  Lipase, blood     Status: None   Collection Time: 04/07/20  1:18 PM  Result Value Ref Range   Lipase 23 11 - 51 U/L  Hepatic function panel     Status: Abnormal   Collection Time: 04/07/20  1:18 PM  Result Value Ref Range   Total Protein 8.3 (H) 6.5 - 8.1 g/dL   Albumin 4.3 3.5 - 5.0 g/dL   AST 19 15 - 41 U/L   ALT 21 0 - 44 U/L   Alkaline Phosphatase 65 38 - 126 U/L   Total Bilirubin 0.5 0.3 - 1.2 mg/dL   Bilirubin, Direct <4.5 0.0 - 0.2 mg/dL   Indirect Bilirubin NOT CALCULATED 0.3 - 0.9 mg/dL   ____________________________________________________  RADIOLOGY  I personally reviewed all radiographic images ordered to evaluate for the above acute complaints and reviewed radiology reports and findings.  These findings were personally discussed with the patient.  Please see medical record for radiology report.  ____________________________________________   PROCEDURES  Procedure(s) performed:  Procedures    Critical Care performed: no ____________________________________________   INITIAL IMPRESSION / ASSESSMENT AND PLAN / ED COURSE  Pertinent labs & imaging results that were available during my care of the patient were reviewed by me and considered in my medical decision making (see chart for details).   DDX: Ectopic, miscarriage, morning sickness, dehydration, hyperemesis, electrolyte abnormality  Bonnie Williams is a 29 y.o. who presents to the ED with symptoms as described above.  Blood work is reassuring.  Abdominal exam soft benign.  Given bleeding ultrasound was ordered which shows reassuring evaluation of fetus in early intrauterine pregnancy no evidence of ectopic.  There is however small subchorionic hematoma which would likely explain some of her bleeding.  Discussed importance of pelvic rest  and close outpatient follow-up with OB/GYN.  She is now tolerating p.o.  I think she is appropriate for close outpatient follow-up.  We discussed strict return precautions.     The patient was evaluated in Emergency Department today for the symptoms described in the history of present illness. He/she was evaluated in the context of the global COVID-19 pandemic, which necessitated consideration that the patient might be at risk for infection with the SARS-CoV-2 virus that causes COVID-19. Institutional protocols and algorithms that pertain to the evaluation of patients at risk for COVID-19 are in a state of rapid change based on information released by regulatory bodies including the CDC and federal and state organizations. These policies and algorithms were followed during the patient's care in the ED.  As part of my medical decision making, I reviewed the following data within the electronic medical  record:  Nursing notes reviewed and incorporated, Labs reviewed, notes from prior ED visits and [place] Controlled Substance Database   ____________________________________________   FINAL CLINICAL IMPRESSION(S) / ED DIAGNOSES  Final diagnoses:  Bleeding in early pregnancy  Non-intractable vomiting with nausea, unspecified vomiting type      NEW MEDICATIONS STARTED DURING THIS VISIT:  New Prescriptions   METOCLOPRAMIDE (REGLAN) 10 MG TABLET    Take 1 tablet (10 mg total) by mouth every 6 (six) hours as needed.     Note:  This document was prepared using Dragon voice recognition software and may include unintentional dictation errors.    Merlyn Lot, MD 04/07/20 [phone]

## 2020-04-07 NOTE — ED Notes (Signed)
ED Provider at bedside. 

## 2020-04-22 LAB — OB RESULTS CONSOLE VARICELLA ZOSTER ANTIBODY, IGG: Varicella: IMMUNE

## 2020-04-22 LAB — OB RESULTS CONSOLE HEPATITIS B SURFACE ANTIGEN: Hepatitis B Surface Ag: NEGATIVE

## 2020-04-22 LAB — OB RESULTS CONSOLE HIV ANTIBODY (ROUTINE TESTING): HIV: NONREACTIVE

## 2020-04-22 LAB — OB RESULTS CONSOLE RUBELLA ANTIBODY, IGM: Rubella: IMMUNE

## 2020-05-07 DIAGNOSIS — Z315 Encounter for genetic counseling: Secondary | ICD-10-CM | POA: Insufficient documentation

## 2020-08-21 LAB — OB RESULTS CONSOLE RPR: RPR: NONREACTIVE

## 2020-09-19 ENCOUNTER — Other Ambulatory Visit: Payer: Self-pay | Admitting: Family Medicine

## 2020-09-19 DIAGNOSIS — Z3689 Encounter for other specified antenatal screening: Secondary | ICD-10-CM

## 2020-09-29 ENCOUNTER — Ambulatory Visit: Payer: Medicaid Other | Attending: Obstetrics and Gynecology

## 2020-09-29 ENCOUNTER — Other Ambulatory Visit: Payer: Self-pay

## 2020-09-29 DIAGNOSIS — Z3A33 33 weeks gestation of pregnancy: Secondary | ICD-10-CM | POA: Diagnosis not present

## 2020-09-29 DIAGNOSIS — Z3689 Encounter for other specified antenatal screening: Secondary | ICD-10-CM | POA: Insufficient documentation

## 2020-09-29 DIAGNOSIS — O26843 Uterine size-date discrepancy, third trimester: Secondary | ICD-10-CM | POA: Diagnosis not present

## 2020-10-17 LAB — OB RESULTS CONSOLE GBS: GBS: NEGATIVE

## 2020-10-18 LAB — OB RESULTS CONSOLE GC/CHLAMYDIA
Chlamydia: NEGATIVE
Gonorrhea: NEGATIVE

## 2020-10-23 ENCOUNTER — Other Ambulatory Visit: Payer: Self-pay | Admitting: Family Medicine

## 2020-10-23 DIAGNOSIS — O26849 Uterine size-date discrepancy, unspecified trimester: Secondary | ICD-10-CM

## 2020-10-24 ENCOUNTER — Encounter: Payer: Self-pay | Admitting: Obstetrics & Gynecology

## 2020-10-24 ENCOUNTER — Other Ambulatory Visit: Payer: Self-pay

## 2020-10-24 ENCOUNTER — Inpatient Hospital Stay: Payer: Medicaid Other | Admitting: Anesthesiology

## 2020-10-24 ENCOUNTER — Inpatient Hospital Stay
Admission: EM | Admit: 2020-10-24 | Discharge: 2020-10-26 | DRG: 798 | Disposition: A | Discharge status: Home / Self Care | Payer: Medicaid Other | Attending: Obstetrics & Gynecology | Admitting: Obstetrics & Gynecology

## 2020-10-24 DIAGNOSIS — Z20822 Contact with and (suspected) exposure to covid-19: Secondary | ICD-10-CM | POA: Diagnosis present

## 2020-10-24 DIAGNOSIS — K8020 Calculus of gallbladder without cholecystitis without obstruction: Secondary | ICD-10-CM | POA: Diagnosis present

## 2020-10-24 DIAGNOSIS — Z302 Encounter for sterilization: Secondary | ICD-10-CM

## 2020-10-24 DIAGNOSIS — O4290 Premature rupture of membranes, unspecified as to length of time between rupture and onset of labor, unspecified weeks of gestation: Secondary | ICD-10-CM | POA: Diagnosis present

## 2020-10-24 DIAGNOSIS — O9902 Anemia complicating childbirth: Secondary | ICD-10-CM | POA: Diagnosis present

## 2020-10-24 DIAGNOSIS — O4292 Full-term premature rupture of membranes, unspecified as to length of time between rupture and onset of labor: Principal | ICD-10-CM | POA: Diagnosis present

## 2020-10-24 DIAGNOSIS — O26893 Other specified pregnancy related conditions, third trimester: Secondary | ICD-10-CM | POA: Diagnosis present

## 2020-10-24 DIAGNOSIS — O99893 Other specified diseases and conditions complicating puerperium: Secondary | ICD-10-CM | POA: Diagnosis present

## 2020-10-24 DIAGNOSIS — O9962 Diseases of the digestive system complicating childbirth: Secondary | ICD-10-CM | POA: Diagnosis present

## 2020-10-24 DIAGNOSIS — Z3A37 37 weeks gestation of pregnancy: Secondary | ICD-10-CM | POA: Diagnosis not present

## 2020-10-24 DIAGNOSIS — R109 Unspecified abdominal pain: Secondary | ICD-10-CM | POA: Diagnosis present

## 2020-10-24 DIAGNOSIS — D509 Iron deficiency anemia, unspecified: Secondary | ICD-10-CM | POA: Diagnosis present

## 2020-10-24 LAB — CBC
HCT: 29.1 % — ABNORMAL LOW (ref 36.0–46.0)
Hemoglobin: 9.6 g/dL — ABNORMAL LOW (ref 12.0–15.0)
MCH: 25.2 pg — ABNORMAL LOW (ref 26.0–34.0)
MCHC: 33 g/dL (ref 30.0–36.0)
MCV: 76.4 fL — ABNORMAL LOW (ref 80.0–100.0)
Platelets: 206 10*3/uL (ref 150–400)
RBC: 3.81 MIL/uL — ABNORMAL LOW (ref 3.87–5.11)
RDW: 13.2 % (ref 11.5–15.5)
WBC: 8.8 10*3/uL (ref 4.0–10.5)
nRBC: 0 % (ref 0.0–0.2)

## 2020-10-24 LAB — TYPE AND SCREEN
ABO/RH(D): O POS
Antibody Screen: NEGATIVE

## 2020-10-24 LAB — RESP PANEL BY RT-PCR (FLU A&B, COVID) ARPGX2
Influenza A by PCR: NEGATIVE
Influenza B by PCR: NEGATIVE
SARS Coronavirus 2 by RT PCR: NEGATIVE

## 2020-10-24 LAB — RUPTURE OF MEMBRANE (ROM)PLUS: Rom Plus: POSITIVE

## 2020-10-24 LAB — ABO/RH: ABO/RH(D): O POS

## 2020-10-24 LAB — GLUCOSE, CAPILLARY: Glucose-Capillary: 122 mg/dL — ABNORMAL HIGH (ref 70–99)

## 2020-10-24 MED ORDER — OXYTOCIN BOLUS FROM INFUSION
333.0000 mL | Freq: Once | INTRAVENOUS | Status: AC
  Administered 2020-10-25: 333 mL via INTRAVENOUS

## 2020-10-24 MED ORDER — EPHEDRINE 5 MG/ML INJ: 10.0000 mg | INTRAVENOUS | Status: DC | PRN

## 2020-10-24 MED ORDER — BUTORPHANOL TARTRATE 1 MG/ML IJ SOLN: 1.0000 mg | INTRAMUSCULAR | Status: DC | PRN

## 2020-10-24 MED ORDER — OXYTOCIN-SODIUM CHLORIDE 30-0.9 UT/500ML-% IV SOLN
1.0000 m[IU]/min | INTRAVENOUS | Status: DC
  Administered 2020-10-24: 2 m[IU]/min via INTRAVENOUS

## 2020-10-24 MED ORDER — LIDOCAINE-EPINEPHRINE (PF) 1.5 %-1:200000 IJ SOLN
INTRAMUSCULAR | Status: DC | PRN
  Administered 2020-10-24: 3 mL via EPIDURAL

## 2020-10-24 MED ORDER — DIPHENHYDRAMINE HCL 50 MG/ML IJ SOLN: 12.5000 mg | INTRAMUSCULAR | Status: DC | PRN

## 2020-10-24 MED ORDER — AMMONIA AROMATIC IN INHA
RESPIRATORY_TRACT | Status: AC
  Filled 2020-10-24: qty 10

## 2020-10-24 MED ORDER — MISOPROSTOL 200 MCG PO TABS
ORAL_TABLET | ORAL | Status: AC
  Filled 2020-10-24: qty 4

## 2020-10-24 MED ORDER — SODIUM CHLORIDE 0.9 % IV SOLN
INTRAVENOUS | Status: DC | PRN
  Administered 2020-10-24: 10 mL via EPIDURAL

## 2020-10-24 MED ORDER — LIDOCAINE HCL (PF) 1 % IJ SOLN
INTRAMUSCULAR | Status: DC | PRN
  Administered 2020-10-24: 2 mL
  Administered 2020-10-24: 3 mL

## 2020-10-24 MED ORDER — FENTANYL 2.5 MCG/ML W/ROPIVACAINE 0.15% IN NS 100 ML EPIDURAL (ARMC)
EPIDURAL | Status: AC
  Filled 2020-10-24: qty 100

## 2020-10-24 MED ORDER — FENTANYL 2.5 MCG/ML W/ROPIVACAINE 0.15% IN NS 100 ML EPIDURAL (ARMC)
EPIDURAL | Status: DC | PRN
  Administered 2020-10-24: 12 mL/h via EPIDURAL

## 2020-10-24 MED ORDER — OXYTOCIN-SODIUM CHLORIDE 30-0.9 UT/500ML-% IV SOLN
2.5000 [IU]/h | INTRAVENOUS | Status: DC
  Administered 2020-10-25: 2.5 [IU]/h via INTRAVENOUS
  Filled 2020-10-24: qty 1000

## 2020-10-24 MED ORDER — LIDOCAINE HCL (PF) 1 % IJ SOLN: 30.0000 mL | INTRAMUSCULAR | Status: DC | PRN

## 2020-10-24 MED ORDER — LACTATED RINGERS IV SOLN
500.0000 mL | INTRAVENOUS | Status: DC | PRN
  Administered 2020-10-24: 500 mL via INTRAVENOUS

## 2020-10-24 MED ORDER — ONDANSETRON HCL 4 MG/2ML IJ SOLN
4.0000 mg | Freq: Four times a day (QID) | INTRAMUSCULAR | Status: DC | PRN
  Administered 2020-10-25: 4 mg via INTRAVENOUS
  Filled 2020-10-24: qty 2

## 2020-10-24 MED ORDER — OXYTOCIN 10 UNIT/ML IJ SOLN
INTRAMUSCULAR | Status: AC
  Filled 2020-10-24: qty 2

## 2020-10-24 MED ORDER — FENTANYL 2.5 MCG/ML W/ROPIVACAINE 0.15% IN NS 100 ML EPIDURAL (ARMC)
12.0000 mL/h | EPIDURAL | Status: DC
  Administered 2020-10-24: 12 mL/h via EPIDURAL

## 2020-10-24 MED ORDER — PHENYLEPHRINE 40 MCG/ML (10ML) SYRINGE FOR IV PUSH (FOR BLOOD PRESSURE SUPPORT): 80.0000 ug | PREFILLED_SYRINGE | INTRAVENOUS | Status: DC | PRN

## 2020-10-24 MED ORDER — SOD CITRATE-CITRIC ACID 500-334 MG/5ML PO SOLN: 30.0000 mL | ORAL | Status: DC | PRN

## 2020-10-24 MED ORDER — ACETAMINOPHEN 325 MG PO TABS: 650.0000 mg | ORAL_TABLET | ORAL | Status: DC | PRN

## 2020-10-24 MED ORDER — LACTATED RINGERS IV SOLN: INTRAVENOUS | Status: DC

## 2020-10-24 MED ORDER — TERBUTALINE SULFATE 1 MG/ML IJ SOLN: 0.2500 mg | Freq: Once | INTRAMUSCULAR | Status: DC | PRN

## 2020-10-24 MED ORDER — LACTATED RINGERS IV SOLN: 500.0000 mL | Freq: Once | INTRAVENOUS | Status: DC

## 2020-10-24 MED ORDER — LIDOCAINE HCL (PF) 1 % IJ SOLN
INTRAMUSCULAR | Status: AC
  Filled 2020-10-24: qty 30

## 2020-10-24 NOTE — H&P (Signed)
OB History & Physical   History of Present Illness:  Chief Complaint: water broken, sent from [name]  HPI:  Bonnie Williams is a 29 y.o. 722P0101 female at 2273w3d dated by US at 8165w0d.  She presents to L&D for ruptured membranes since yesterday morning around 0530. Denies abdominal tenderness, fever, contractions or vaginal bleeding. Reports active FM.    Pregnancy Issues: 1. Hx Preterm birth at 6335wks with G1 2. Hx Pre-eclampsia with G1 3. Elevated 1hr GTT 232, but passed 3hr GTT 4. Uterine S>D, growth US by MFM, EFW 91%ile, 2706g at 4683w5d   Maternal Medical History:   Past Medical History:  Diagnosis Date  . Anemia     History reviewed. No pertinent surgical history.  Allergies  Allergen Reactions  . Sulfa Antibiotics Swelling and Rash    Prior to Admission medications   Medication Sig Start Date End Date Taking? Authorizing Provider  aspirin 81 MG chewable tablet Chew by mouth daily.   Yes [provider]  metoCLOPramide (REGLAN) 10 MG tablet Take 1 tablet (10 mg total) by mouth every 6 (six) hours as needed. 04/07/20 04/07/21 Yes Willy Eddyobinson, Patrick, MD  Prenatal Vit-Fe Fumarate-FA (PRENATAL MULTIVITAMIN) TABS tablet Take 1 tablet by mouth daily at 12 noon.   Yes [provider]     Pren[facility] care site: [name]  Social History: She  reports that she has never smoked. She has never used smokeless tobacco. She reports previous alcohol use. She reports that she does not use drugs.  Family History: pts daughter had leukemia.   Review of Systems: A full review of systems was performed and negative except as noted in the HPI.     Physical Exam:  Vital Signs: BP 124/75   Pulse 91   Temp 98 F (36.7 C) (Oral)   Resp 17   Ht 5' (1.524 m)   Wt 80.3 kg   LMP 02/05/2020   BMI 34.57 kg/m  General: no acute distress.  HEENT: normocephalic, atraumatic Heart: regular rate & rhythm.  No murmurs/rubs/gallops Lungs: clear to auscultation  bilaterally, normal respiratory effort Abdomen: soft, gravid, non-tender;  EFW: 8lbs Pelvic:   External: Normal external female genitalia  Cervix: Dilation: 3 / Effacement (%): 50 /      Extremities: non-tender, symmetric, no edema bilaterally.  DTRs: 2+  Neurologic: Alert & oriented x 3.    Results for orders placed or performed during the hospital encounter of 10/24/20 (from the past 24 hour(s))  ROM Plus ([facility] only)     Status: None   Collection Time: 10/24/20  2:56 PM  Result Value Ref Range   Rom Plus POSITIVE   Resp Panel by RT-PCR (Flu A&B, Covid) Nasopharyngeal Swab     Status: None   Collection Time: 10/24/20  3:12 PM   Specimen: Nasopharyngeal Swab; Nasopharyngeal(NP) swabs in vial transport medium  Result Value Ref Range   SARS Coronavirus 2 by RT PCR NEGATIVE NEGATIVE   Influenza A by PCR NEGATIVE NEGATIVE   Influenza B by PCR NEGATIVE NEGATIVE  CBC     Status: Abnormal   Collection Time: 10/24/20  3:28 PM  Result Value Ref Range   WBC 8.8 4.0 - 10.5 K/uL   RBC 3.81 (L) 3.87 - 5.11 MIL/uL   Hemoglobin 9.6 (L) 12.0 - 15.0 g/dL   HCT 40.929.1 (L) 36 - 46 %   MCV 76.4 (L) 80.0 - 100.0 fL   MCH 25.2 (L) 26.0 - 34.0 pg   MCHC 33.0  30.0 - 36.0 g/dL   RDW 16.1 09.6 - 04.5 %   Platelets 206 150 - 400 K/uL   nRBC 0.0 0.0 - 0.2 %  Type and screen [facility]     Status: None   Collection Time: 10/24/20  3:28 PM  Result Value Ref Range   ABO/RH(D) O POS    Antibody Screen NEG    Sample Expiration      10/27/2020,2359 Performed at [facility], [address] [place], [place] [address]   Glucose, capillary     Status: Abnormal   Collection Time: 10/24/20  6:20 PM  Result Value Ref Range   Glucose-Capillary 122 (H) 70 - 99 mg/dL    Pertinent Results:  Prenatal Labs: Blood type/Rh  O pos  Antibody screen neg  Rubella Immune  Varicella Immune  RPR NR  HBsAg Neg  HIV NR  GC neg  Chlamydia neg  Genetic screening negative  1 hour GTT   232  3 hour GTT  [phone]  GBS  negative   FHT: 145bpm, mod variability, no decels, + accels TOCO: Irregular, occasional, Pitocin at 42mu/min SVE:  Dilation: 3 / Effacement (%): 50 /      Cephalic by leopolds and SVE  [place] MFM OB COMP + 14 WK  Result Date: 09/29/2020 ----------------------------------------------------------------------  OBSTETRICS REPORT                       (Signed Final 09/29/2020 02:08 pm) ---------------------------------------------------------------------- Patient Info  ID #:       [id number]                          D.O.B.:  [birth year] (29 yrs)  Name:       Bonnie Allegra                 Visit Date: 09/29/2020 01:40 pm              Williams ---------------------------------------------------------------------- Performed By  Attending:        Noralee Space MD        Ref. Address:     [address]                                                             [place], [place]                                                             [address]  Performed By:     Neita Carp RDMS        Location:         [facility]                                                             at [facility]  Referred By:      Emogene Morgan                    MD ---------------------------------------------------------------------- Orders  #  Description                           Code        Ordered By  1  [place] MFM OB COMP + 14 WK                76805.01    NGWE AYCOCK ----------------------------------------------------------------------  #  Order #                     Accession #                Episode #  1  [id number]                   [id number]                 [id number] ---------------------------------------------------------------------- Indications  [redacted] weeks gestation of pregnancy                 Z3A.33  Uterine size-date discrepancy, third trimester O26.843 ---------------------------------------------------------------------- Fetal Evaluation  Num Of Fetuses:         1  Fetal Heart Rate(bpm):  160  Cardiac Activity:       Observed  Presentation:           Cephalic  Placenta:               Anterior  P. Cord Insertion:      Visualized, central  AFI Sum(cm)     %Tile       Largest Pocket(cm)  14.92           53          5.85  RUQ(cm)       RLQ(cm)       LUQ(cm)        LLQ(cm)  5.85          2.97          3.38           2.72 ---------------------------------------------------------------------- Biometry  BPD:      88.6  mm     G. Age:  35w 6d         94  %    CI:        74.95   %    70 - 86  FL/HC:      20.1   %    19.4 - 21.8  HC:      324.7  mm     G. Age:  36w 5d         87  %    HC/AC:      1.01        0.96 - 1.11  AC:      322.2  mm     G. Age:  36w 1d         97  %    FL/BPD:     73.8   %    71 - 87  FL:       65.4  mm     G. Age:  33w 5d         39  %    FL/AC:      20.3   %    20 - 24  HUM:      60.7  mm     G. Age:  35w 1d         87  %  LV:        5.5  mm  Est. FW:    2706  gm    5 lb 15 oz      91  % ---------------------------------------------------------------------- Gestational Age  LMP:           33w 5d        Date:  02/06/20                 EDD:   11/12/20  U/S Today:     35w 4d                                        EDD:   10/30/20  Best:          33w 5d     Det. By:  LMP  (02/06/20)          EDD:   11/12/20 ---------------------------------------------------------------------- Anatomy  Cranium:               Appears normal         Aortic Arch:            Appears normal  Cavum:                 Appears normal         Ductal Arch:            Not well visualized  Ventricles:            Appears normal         Diaphragm:              Appears normal  Choroid Plexus:        Appears normal         Stomach:                Appears normal,  left  sided  Cerebellum:            Appears normal         Abdomen:                Appears normal  Posterior Fossa:       Appears normal         Abdominal Wall:         Appears nml (cord                                                                        insert, abd wall)  Nuchal Fold:           Appears normal         Cord Vessels:           Appears normal (3                                                                        vessel cord)  Face:                  Appears normal         Kidneys:                Appear normal                         (orbits and profile)  Lips:                  Appears normal         Bladder:                Appears normal  Heart:                 Appears normal         Spine:                  Appears normal                         (4CH, axis, and                         situs)  RVOT:                  Appears normal         Upper Extremities:      Appears normal  LVOT:                  Appears normal         Lower Extremities:      Appears normal  Other:  Ductaal Arch and SVC/IVC not visualized due to fetal position and          AGA ---------------------------------------------------------------------- Impression  Patient is here for ultrasound evaluation because of uterine  size-date discrepancy on clinical examination.  Her  prenatal course as been uneventful.  On cell free fetal  DNA screening, the risks of fetal aneuploidies were not  increased.  Patient does not have gestational diabetes.  However she had abnormal 1 hour glucose challenge test  (232 mg/DL) but subsequently had a normal 3-hour GTT.  Obstetric history significant for a preterm vaginal delivery in  2015 of a female infant weighing 7 pounds 9 ounces at birth.  Her pregnancy was complicated by preeclampsia and she  underwent induction of labor. Patient takes low-dose aspirin  prophylaxis.  On today's ultrasound, the estimated fetal weight is at the   91st percentile.  Amniotic fluid is normal good fetal activity  seen.  Fetal anatomical survey appears normal but limited by  advanced gestational age.  I reassured the patient of the findings.  Ultrasound has  limitations in accurately estimating fetal weights.  Although gestational diabetes has been ruled out, increased  1-hour result can be associated with GDM in some cases.  I  recommend occasional fasting or random blood glucose  measurement at your office. ---------------------------------------------------------------------- Recommendations  -An appointment was made for her to return in 4 weeks for  fetal growth assessment. ----------------------------------------------------------------------                  Noralee Space, MD Electronically Signed Final Report   09/29/2020 02:08 pm ----------------------------------------------------------------------   Assessment:  Christella App is a 29 y.o. G69P0101 female at [redacted]w[redacted]d with PROM >36hrs.   Plan:  1. Admit to Labor & Delivery; consents reviewed and obtained - induction of labor due to prolonged ROM.  - monitor closely for sx chorio.  - COVID neg on admit - elevated 1hr GTT, CBG done random 122, A1C added onto admit labs.    2. Fetal Well being  - Fetal Tracing: Cat I tracing - Group B Streptococcus ppx indicated: negative - Presentation: cephalic confirmed by exam   3. Routine OB: - Prenatal labs reviewed, as above - Rh O Pos - CBC, T&S, RPR on admit - Clear fluids, IVF  4. Induction of Labor -  Contractions: external toco in place -  Pelvis proven to 7#9 -  Plan for induction with PItocin and AROM forebag.  -  Plan for continuous fetal monitoring  -  Maternal pain control as desired - Anticipate vaginal delivery  5. Post Partum Planning: - Infant feeding: breast - Contraception: desires BTL- consent signed on 10/20 per [facility] notes.  - tdap: 09/03/20 - flu: needs prior to DC  Randa Ngo, CNM 10/24/20 6:28  PM

## 2020-10-24 NOTE — Anesthesia Preprocedure Evaluation (Signed)
Anesthesia Evaluation  Patient identified by MRN, date of birth, ID band Patient awake    Reviewed: Allergy & Precautions, NPO status , Patient's Chart, lab work & pertinent test results  History of Anesthesia Complications Negative for: history of anesthetic complications  Airway Mallampati: II  TM Distance: >3 FB Neck ROM: Full    Dental no notable dental hx. (+) Teeth Intact   Pulmonary neg pulmonary ROS, neg sleep apnea, neg COPD, Patient abstained from smoking.Not current smoker,    Pulmonary exam normal breath sounds clear to auscultation       Cardiovascular Exercise Tolerance: Good METS(-) hypertension(-) CAD and (-) Past MI negative cardio ROS  (-) dysrhythmias  Rhythm:Regular Rate:Normal - Systolic murmurs    Neuro/Psych negative neurological ROS  negative psych ROS   GI/Hepatic neg GERD  ,(+)     (-) substance abuse  ,   Endo/Other  neg diabetes  Renal/GU negative Renal ROS     Musculoskeletal   Abdominal   Peds  Hematology  (+) anemia ,   Anesthesia Other Findings Past Medical History: No date: Anemia  Reproductive/Obstetrics (+) Pregnancy                             Anesthesia Physical Anesthesia Plan  ASA: II  Anesthesia Plan: Epidural   Post-op Pain Management:    Induction:   PONV Risk Score and Plan: 2 and Treatment may vary due to age or medical condition and Ondansetron  Airway Management Planned: Natural Airway  Additional Equipment:   Intra-op Plan:   Post-operative Plan:   Informed Consent: I have reviewed the patients History and Physical, chart, labs and discussed the procedure including the risks, benefits and alternatives for the proposed anesthesia with the patient or authorized representative who has indicated his/her understanding and acceptance.       Plan Discussed with: Surgeon  Anesthesia Plan Comments: (Discussed R/B/A of  neuraxial anesthesia technique with patient: - rare risks of spinal/epidural hematoma, nerve damage, infection - Risk of PDPH - Risk of itching - Risk of nausea and vomiting - Risk of poor block necessitating replacement of epidural. Patient voiced understanding.)        Anesthesia Quick Evaluation

## 2020-10-24 NOTE — OB Triage Note (Signed)
Pt G2P1 [redacted]w[redacted]d presents with LOF since 0530 yesterday morning. Pt reports when she got up she had clear watery fluid running down her leg that continued throughout the day. Pt was seen [facility] this AM and was told to come over to hospital for SROM. +FM. Denies bleeding. Reports tightness every 5-6 minutes that are not painful. No complications this pregnancy. Pt reports baby is measuring 41w. Pt taking aspirin due to hx of preE.

## 2020-10-24 NOTE — Progress Notes (Signed)
Dr. Elesa Massed paged as patient is complete. Awaiting call back.

## 2020-10-24 NOTE — Anesthesia Procedure Notes (Signed)
Epidural Patient location during procedure: OB Start time: 10/24/2020 8:31 PM End time: 10/24/2020 9:03 PM  Staffing Anesthesiologist: Corinda Gubler, MD Performed: anesthesiologist   Preanesthetic Checklist Completed: patient identified, IV checked, site marked, risks and benefits discussed, surgical consent, monitors and equipment checked, pre-op evaluation and timeout performed  Epidural Patient position: sitting Prep: ChloraPrep Patient monitoring: heart rate, continuous pulse ox and blood pressure Approach: midline Location: L2-L3 Injection technique: LOR saline  Needle:  Needle type: Tuohy  Needle gauge: 17 G Needle length: 9 cm and 9 Needle insertion depth: 6 cm Catheter type: closed end flexible Catheter size: 19 Gauge Catheter at skin depth: 12 cm Test dose: negative and 1.5% lidocaine with Epi 1:200 K  Assessment Sensory level: T10 Events: blood not aspirated, injection not painful, no injection resistance, no paresthesia and negative IV test  Additional Notes first attempt Pt. Evaluated and documentation done after procedure finished. Patient identified. Risks/Benefits/Options discussed with patient including but not limited to bleeding, infection, nerve damage, paralysis, failed block, incomplete pain control, headache, blood pressure changes, nausea, vomiting, reactions to medication both or allergic, itching and postpartum back pain. Confirmed with bedside nurse the patient's most recent platelet count. Confirmed with patient that they are not currently taking any anticoagulation, have any bleeding history or any family history of bleeding disorders. Patient expressed understanding and wished to proceed. All questions were answered. Sterile technique was used throughout the entire procedure. Please see nursing notes for vital signs. Test dose was given through epidural catheter and negative prior to continuing to dose epidural or start infusion. Warning signs of high  block given to the patient including shortness of breath, tingling/numbness in hands, complete motor block, or any concerning symptoms with instructions to call for help. Patient was given instructions on fall risk and not to get out of bed. All questions and concerns addressed with instructions to call with any issues or inadequate analgesia.   Patient tolerated the insertion well without immediate complications.Reason for block:procedure for pain

## 2020-10-24 NOTE — Progress Notes (Signed)
Labor Progress Note  Bonnie Williams is a 29 y.o. G2P0101 at [redacted]w[redacted]d by ultrasound admitted for PROM  Subjective: feeling cramping and some painful UCs.   Objective: BP 128/81 (BP Location: Right Arm)   Pulse 79   Temp 98.2 F (36.8 C) (Oral)   Resp 16   Ht 5' (1.524 m)   Wt 80.3 kg   LMP 02/05/2020   BMI 34.57 kg/m  Notable VS details: reviewed, afebrile.   Fetal Assessment: FHT:  FHR: 150 bpm, variability: moderate,  accelerations:  Present,  decelerations:  Absent Category/reactivity:  Category I UC:   regular, every 2-4 minutes. Pitocin at 63mu/min SVE:   3-4/60/-2, soft/posterior, AROM forebag, mod amount clear fluid noted.  Membrane status: SROM 11/18 at 0530 Amniotic color: clear  Labs: Lab Results  Component Value Date   WBC 8.8 10/24/2020   HGB 9.6 (L) 10/24/2020   HCT 29.1 (L) 10/24/2020   MCV 76.4 (L) 10/24/2020   PLT 206 10/24/2020    Assessment / Plan: PROM x 38hrs, early labor, augmentation with Pitocin at [redacted]w[redacted]d  Labor: AROM forebag, continue pitocin titration.  Preeclampsia:  no e/o Pre-e Fetal Wellbeing:  Category I Pain Control:  Labor support without medications I/D:  n/a Anticipated MOD:  NSVD  Randa Ngo, CNM 10/24/2020, 7:25 PM

## 2020-10-24 NOTE — Progress Notes (Signed)
Dr. Elesa Massed stated she is coming to attend delivery. Will notify primary RN of plan.

## 2020-10-25 ENCOUNTER — Encounter: Payer: Self-pay | Admitting: Obstetrics & Gynecology

## 2020-10-25 ENCOUNTER — Inpatient Hospital Stay: Payer: Medicaid Other

## 2020-10-25 ENCOUNTER — Inpatient Hospital Stay: Payer: Medicaid Other | Admitting: Certified Registered"

## 2020-10-25 ENCOUNTER — Encounter
Admission: EM | Disposition: A | Discharge status: Home / Self Care | Payer: Self-pay | Source: Home / Self Care | Attending: Obstetrics & Gynecology

## 2020-10-25 HISTORY — PX: TUBAL LIGATION: SHX77

## 2020-10-25 LAB — GLUCOSE, CAPILLARY: Glucose-Capillary: 83 mg/dL (ref 70–99)

## 2020-10-25 LAB — CBC
HCT: 30.6 % — ABNORMAL LOW (ref 36.0–46.0)
HCT: 30.7 % — ABNORMAL LOW (ref 36.0–46.0)
Hemoglobin: 10.2 g/dL — ABNORMAL LOW (ref 12.0–15.0)
Hemoglobin: 10 g/dL — ABNORMAL LOW (ref 12.0–15.0)
MCH: 25.1 pg — ABNORMAL LOW (ref 26.0–34.0)
MCH: 25.2 pg — ABNORMAL LOW (ref 26.0–34.0)
MCHC: 33.3 g/dL (ref 30.0–36.0)
MCHC: 32.6 g/dL (ref 30.0–36.0)
MCV: 75.4 fL — ABNORMAL LOW (ref 80.0–100.0)
MCV: 77.3 fL — ABNORMAL LOW (ref 80.0–100.0)
Platelets: 205 10*3/uL (ref 150–400)
Platelets: 193 10*3/uL (ref 150–400)
RBC: 4.06 MIL/uL (ref 3.87–5.11)
RBC: 3.97 MIL/uL (ref 3.87–5.11)
RDW: 13.1 % (ref 11.5–15.5)
RDW: 13.2 % (ref 11.5–15.5)
WBC: 15.2 10*3/uL — ABNORMAL HIGH (ref 4.0–10.5)
WBC: 11.9 10*3/uL — ABNORMAL HIGH (ref 4.0–10.5)
nRBC: 0 % (ref 0.0–0.2)
nRBC: 0 % (ref 0.0–0.2)

## 2020-10-25 LAB — HEMOGLOBIN A1C
Hgb A1c MFr Bld: 6.1 % — ABNORMAL HIGH (ref 4.8–5.6)
Mean Plasma Glucose: 128.37 mg/dL

## 2020-10-25 LAB — RPR: RPR Ser Ql: NONREACTIVE

## 2020-10-25 SURGERY — LIGATION, FALLOPIAN TUBE, POSTPARTUM
Anesthesia: Spinal | Laterality: Bilateral

## 2020-10-25 SURGERY — LIGATION, FALLOPIAN TUBE, POSTPARTUM
Anesthesia: Spinal | Site: Abdomen | Laterality: Bilateral

## 2020-10-25 MED ORDER — FENTANYL CITRATE (PF) 100 MCG/2ML IJ SOLN
INTRAMUSCULAR | Status: AC
  Filled 2020-10-25: qty 2

## 2020-10-25 MED ORDER — IBUPROFEN 600 MG PO TABS
600.0000 mg | ORAL_TABLET | Freq: Four times a day (QID) | ORAL | Status: DC
  Administered 2020-10-25 (×2): 600 mg via ORAL
  Filled 2020-10-25 (×2): qty 1

## 2020-10-25 MED ORDER — WITCH HAZEL-GLYCERIN EX PADS
1.0000 "application " | MEDICATED_PAD | CUTANEOUS | Status: DC
  Administered 2020-10-25: 1 via TOPICAL
  Filled 2020-10-25 (×2): qty 100

## 2020-10-25 MED ORDER — PHENYLEPHRINE HCL (PRESSORS) 10 MG/ML IV SOLN
INTRAVENOUS | Status: DC | PRN
  Administered 2020-10-25 (×2): 100 ug via INTRAVENOUS

## 2020-10-25 MED ORDER — DEXTROSE IN LACTATED RINGERS 5 % IV SOLN: INTRAVENOUS | Status: DC

## 2020-10-25 MED ORDER — ONDANSETRON HCL 4 MG/2ML IJ SOLN: 4.0000 mg | Freq: Once | INTRAMUSCULAR | Status: DC | PRN

## 2020-10-25 MED ORDER — PROPOFOL 10 MG/ML IV BOLUS
INTRAVENOUS | Status: AC
  Filled 2020-10-25: qty 20

## 2020-10-25 MED ORDER — ONDANSETRON HCL 4 MG/2ML IJ SOLN
INTRAMUSCULAR | Status: AC
  Filled 2020-10-25: qty 2

## 2020-10-25 MED ORDER — HYDROCORTISONE (PERIANAL) 2.5 % EX CREA
TOPICAL_CREAM | CUTANEOUS | Status: DC
  Filled 2020-10-25: qty 28.35

## 2020-10-25 MED ORDER — BENZOCAINE-MENTHOL 20-0.5 % EX AERO
1.0000 "application " | INHALATION_SPRAY | CUTANEOUS | Status: DC | PRN
  Administered 2020-10-25: 1 via TOPICAL
  Filled 2020-10-25 (×2): qty 56

## 2020-10-25 MED ORDER — IBUPROFEN 600 MG PO TABS: 600.0000 mg | ORAL_TABLET | Freq: Four times a day (QID) | ORAL | Status: DC

## 2020-10-25 MED ORDER — SIMETHICONE 80 MG PO CHEW
160.0000 mg | CHEWABLE_TABLET | Freq: Four times a day (QID) | ORAL | Status: DC | PRN
  Administered 2020-10-25 – 2020-10-26 (×3): 160 mg via ORAL
  Filled 2020-10-25 (×3): qty 2

## 2020-10-25 MED ORDER — PROPOFOL 10 MG/ML IV BOLUS
INTRAVENOUS | Status: DC | PRN
  Administered 2020-10-25: 80 mg via INTRAVENOUS

## 2020-10-25 MED ORDER — LACTATED RINGERS IV SOLN: INTRAVENOUS | Status: DC | PRN

## 2020-10-25 MED ORDER — PROMETHAZINE HCL 25 MG/ML IJ SOLN: 12.5000 mg | Freq: Four times a day (QID) | INTRAMUSCULAR | Status: DC | PRN

## 2020-10-25 MED ORDER — METHYLERGONOVINE MALEATE 0.2 MG/ML IJ SOLN
INTRAMUSCULAR | Status: AC
  Administered 2020-10-25: 0.2 mg
  Filled 2020-10-25: qty 1

## 2020-10-25 MED ORDER — COCONUT OIL OIL
1.0000 "application " | TOPICAL_OIL | Status: DC | PRN
  Administered 2020-10-25: 1 via TOPICAL
  Filled 2020-10-25: qty 120

## 2020-10-25 MED ORDER — MIDAZOLAM HCL 5 MG/5ML IJ SOLN
INTRAMUSCULAR | Status: DC | PRN
  Administered 2020-10-25: 2 mg via INTRAVENOUS

## 2020-10-25 MED ORDER — SIMETHICONE 80 MG PO CHEW: 80.0000 mg | CHEWABLE_TABLET | ORAL | Status: DC | PRN

## 2020-10-25 MED ORDER — DIPHENHYDRAMINE HCL 25 MG PO CAPS
25.0000 mg | ORAL_CAPSULE | Freq: Four times a day (QID) | ORAL | Status: DC | PRN
  Administered 2020-10-25: 25 mg via ORAL
  Filled 2020-10-25: qty 1

## 2020-10-25 MED ORDER — HYDROMORPHONE HCL 1 MG/ML IJ SOLN
0.2000 mg | INTRAMUSCULAR | Status: DC | PRN
  Administered 2020-10-25 (×2): 0.6 mg via INTRAVENOUS
  Filled 2020-10-25 (×2): qty 1

## 2020-10-25 MED ORDER — ONDANSETRON HCL 4 MG PO TABS
4.0000 mg | ORAL_TABLET | ORAL | Status: DC | PRN
  Filled 2020-10-25: qty 1

## 2020-10-25 MED ORDER — FENTANYL CITRATE (PF) 100 MCG/2ML IJ SOLN
25.0000 ug | INTRAMUSCULAR | Status: DC | PRN
  Administered 2020-10-25 (×6): 25 ug via INTRAVENOUS

## 2020-10-25 MED ORDER — PRENATAL MULTIVITAMIN CH: 1.0000 | ORAL_TABLET | Freq: Every day | ORAL | Status: DC

## 2020-10-25 MED ORDER — DEXAMETHASONE SODIUM PHOSPHATE 10 MG/ML IJ SOLN
INTRAMUSCULAR | Status: DC | PRN
  Administered 2020-10-25: 10 mg via INTRAVENOUS

## 2020-10-25 MED ORDER — LIDOCAINE HCL (PF) 2 % IJ SOLN
INTRAMUSCULAR | Status: AC
  Filled 2020-10-25: qty 5

## 2020-10-25 MED ORDER — MIDAZOLAM HCL 2 MG/2ML IJ SOLN
INTRAMUSCULAR | Status: AC
  Filled 2020-10-25: qty 2

## 2020-10-25 MED ORDER — DEXAMETHASONE SODIUM PHOSPHATE 10 MG/ML IJ SOLN
INTRAMUSCULAR | Status: AC
  Filled 2020-10-25: qty 1

## 2020-10-25 MED ORDER — BUPIVACAINE IN DEXTROSE 0.75-8.25 % IT SOLN
INTRATHECAL | Status: DC | PRN
  Administered 2020-10-25: 1.4 mL via INTRATHECAL

## 2020-10-25 MED ORDER — OXYCODONE HCL 5 MG PO TABS
5.0000 mg | ORAL_TABLET | ORAL | Status: DC | PRN
  Administered 2020-10-25 – 2020-10-26 (×2): 5 mg via ORAL
  Administered 2020-10-26: 10 mg via ORAL
  Administered 2020-10-26: 5 mg via ORAL
  Filled 2020-10-25: qty 1
  Filled 2020-10-25: qty 2
  Filled 2020-10-25 (×2): qty 1

## 2020-10-25 MED ORDER — BISACODYL 10 MG RE SUPP: 10.0000 mg | Freq: Every day | RECTAL | Status: DC | PRN

## 2020-10-25 MED ORDER — PROPOFOL 500 MG/50ML IV EMUL
INTRAVENOUS | Status: AC
  Filled 2020-10-25: qty 50

## 2020-10-25 MED ORDER — DOCUSATE SODIUM 100 MG PO CAPS
100.0000 mg | ORAL_CAPSULE | Freq: Two times a day (BID) | ORAL | Status: DC
  Administered 2020-10-25 – 2020-10-26 (×2): 100 mg via ORAL
  Filled 2020-10-25 (×2): qty 1

## 2020-10-25 MED ORDER — BUPIVACAINE HCL (PF) 0.5 % IJ SOLN
INTRAMUSCULAR | Status: AC
  Filled 2020-10-25: qty 30

## 2020-10-25 MED ORDER — ONDANSETRON HCL 4 MG/2ML IJ SOLN
4.0000 mg | INTRAMUSCULAR | Status: DC | PRN
  Administered 2020-10-25 (×2): 4 mg via INTRAVENOUS
  Filled 2020-10-25 (×2): qty 2

## 2020-10-25 MED ORDER — KETOROLAC TROMETHAMINE 30 MG/ML IJ SOLN
30.0000 mg | Freq: Four times a day (QID) | INTRAMUSCULAR | Status: DC | PRN
  Administered 2020-10-25 – 2020-10-26 (×3): 30 mg via INTRAVENOUS
  Filled 2020-10-25 (×3): qty 1

## 2020-10-25 MED ORDER — ONDANSETRON HCL 4 MG/2ML IJ SOLN
INTRAMUSCULAR | Status: DC | PRN
  Administered 2020-10-25: 4 mg via INTRAVENOUS

## 2020-10-25 MED ORDER — PROPOFOL 500 MG/50ML IV EMUL
INTRAVENOUS | Status: DC | PRN
  Administered 2020-10-25: 50 ug/kg/min via INTRAVENOUS

## 2020-10-25 MED ORDER — BUPIVACAINE HCL 0.5 % IJ SOLN
INTRAMUSCULAR | Status: DC | PRN
  Administered 2020-10-25: 5 mL

## 2020-10-25 MED ORDER — ACETAMINOPHEN 500 MG PO TABS
1000.0000 mg | ORAL_TABLET | Freq: Four times a day (QID) | ORAL | Status: DC | PRN
  Administered 2020-10-25 – 2020-10-26 (×2): 1000 mg via ORAL
  Filled 2020-10-25 (×2): qty 2

## 2020-10-25 MED ORDER — PROMETHAZINE HCL 25 MG/ML IJ SOLN: 25.0000 mg | Freq: Four times a day (QID) | INTRAMUSCULAR | Status: DC | PRN

## 2020-10-25 SURGICAL SUPPLY — 36 items
BLADE SURG 15 STRL LF DISP TIS (BLADE) IMPLANT
BLADE SURG 15 STRL SS (BLADE)
BLADE SURG SZ11 CARB STEEL (BLADE) ×3 IMPLANT
CHLORAPREP W/TINT 26 (MISCELLANEOUS) ×3 IMPLANT
COVER WAND RF STERILE (DRAPES) ×3 IMPLANT
DERMABOND ADVANCED (GAUZE/BANDAGES/DRESSINGS) ×2
DERMABOND ADVANCED .7 DNX12 (GAUZE/BANDAGES/DRESSINGS) ×1 IMPLANT
DRAPE LAPAROTOMY 100X77 ABD (DRAPES) ×3 IMPLANT
DRSG TEGADERM 2-3/8X2-3/4 SM (GAUZE/BANDAGES/DRESSINGS) ×3 IMPLANT
DRSG TELFA 4X3 1S NADH ST (GAUZE/BANDAGES/DRESSINGS) ×3 IMPLANT
ELECT CAUTERY BLADE 6.4 (BLADE) ×3 IMPLANT
ELECT REM PT RETURN 9FT ADLT (ELECTROSURGICAL) ×3
ELECTRODE REM PT RTRN 9FT ADLT (ELECTROSURGICAL) ×1 IMPLANT
GLOVE PI ORTHOPRO 6.5 (GLOVE) ×2
GLOVE PI ORTHOPRO STRL 6.5 (GLOVE) ×1 IMPLANT
GLOVE SURG SYN 6.5 ES PF (GLOVE) ×3 IMPLANT
GOWN STRL REUS W/ TWL LRG LVL3 (GOWN DISPOSABLE) ×2 IMPLANT
GOWN STRL REUS W/TWL LRG LVL3 (GOWN DISPOSABLE) ×4
KIT TURNOVER CYSTO (KITS) ×3 IMPLANT
LABEL OR SOLS (LABEL) ×3 IMPLANT
LIGASURE VESSEL 5MM BLUNT TIP (ELECTROSURGICAL) IMPLANT
MANIFOLD NEPTUNE II (INSTRUMENTS) ×3 IMPLANT
NEEDLE HYPO 22GX1.5 SAFETY (NEEDLE) ×3 IMPLANT
NS IRRIG 500ML POUR BTL (IV SOLUTION) ×3 IMPLANT
PACK BASIN MINOR (MISCELLANEOUS) ×3 IMPLANT
RETRACTOR RING XSMALL (MISCELLANEOUS) IMPLANT
RETRACTOR WOUND ALXS 18CM SML (MISCELLANEOUS) IMPLANT
RTRCTR WOUND ALEXIS 13CM XS SH (MISCELLANEOUS)
RTRCTR WOUND ALEXIS O 18CM SML (MISCELLANEOUS)
SPONGE LAP 18X18 RF (DISPOSABLE) ×3 IMPLANT
SUT MNCRL AB 4-0 PS2 18 (SUTURE) ×3 IMPLANT
SUT VIC AB 2-0 CT1 27 (SUTURE) ×2
SUT VIC AB 2-0 CT1 TAPERPNT 27 (SUTURE) ×1 IMPLANT
SUT VIC AB 2-0 UR6 27 (SUTURE) ×6 IMPLANT
SYR 10ML LL (SYRINGE) ×3 IMPLANT
TRAY FOLEY MTR SLVR 16FR STAT (SET/KITS/TRAYS/PACK) IMPLANT

## 2020-10-25 SURGICAL SUPPLY — 36 items
BLADE SURG 15 STRL LF DISP TIS (BLADE) IMPLANT
BLADE SURG 15 STRL SS (BLADE)
BLADE SURG SZ11 CARB STEEL (BLADE) ×3 IMPLANT
CHLORAPREP W/TINT 26 (MISCELLANEOUS) ×3 IMPLANT
COVER WAND RF STERILE (DRAPES) IMPLANT
DERMABOND ADVANCED (GAUZE/BANDAGES/DRESSINGS) ×2
DERMABOND ADVANCED .7 DNX12 (GAUZE/BANDAGES/DRESSINGS) ×1 IMPLANT
DRAPE LAPAROTOMY 100X77 ABD (DRAPES) ×3 IMPLANT
DRSG TEGADERM 2-3/8X2-3/4 SM (GAUZE/BANDAGES/DRESSINGS) IMPLANT
DRSG TELFA 4X3 1S NADH ST (GAUZE/BANDAGES/DRESSINGS) IMPLANT
ELECT CAUTERY BLADE 6.4 (BLADE) ×3 IMPLANT
ELECT REM PT RETURN 9FT ADLT (ELECTROSURGICAL) ×3
ELECTRODE REM PT RTRN 9FT ADLT (ELECTROSURGICAL) ×1 IMPLANT
GLOVE PI ORTHOPRO 6.5 (GLOVE) ×2
GLOVE PI ORTHOPRO STRL 6.5 (GLOVE) ×1 IMPLANT
GLOVE SURG SYN 6.5 ES PF (GLOVE) ×3 IMPLANT
GOWN STRL REUS W/ TWL LRG LVL3 (GOWN DISPOSABLE) ×2 IMPLANT
GOWN STRL REUS W/TWL LRG LVL3 (GOWN DISPOSABLE) ×4
KIT TURNOVER CYSTO (KITS) ×3 IMPLANT
LABEL OR SOLS (LABEL) ×3 IMPLANT
LIGASURE VESSEL 5MM BLUNT TIP (ELECTROSURGICAL) IMPLANT
MANIFOLD NEPTUNE II (INSTRUMENTS) ×3 IMPLANT
NEEDLE HYPO 22GX1.5 SAFETY (NEEDLE) ×3 IMPLANT
NS IRRIG 500ML POUR BTL (IV SOLUTION) ×3 IMPLANT
PACK BASIN MINOR (MISCELLANEOUS) ×3 IMPLANT
RETRACTOR RING XSMALL (MISCELLANEOUS) IMPLANT
RETRACTOR WOUND ALXS 18CM SML (MISCELLANEOUS) ×1 IMPLANT
RTRCTR WOUND ALEXIS 13CM XS SH (MISCELLANEOUS)
RTRCTR WOUND ALEXIS O 18CM SML (MISCELLANEOUS) ×3
SPONGE LAP 18X18 RF (DISPOSABLE) ×3 IMPLANT
SUT MNCRL AB 4-0 PS2 18 (SUTURE) ×3 IMPLANT
SUT VIC AB 2-0 CT1 27 (SUTURE) ×6
SUT VIC AB 2-0 CT1 TAPERPNT 27 (SUTURE) ×3 IMPLANT
SUT VIC AB 2-0 UR6 27 (SUTURE) ×6 IMPLANT
SYR 10ML LL (SYRINGE) ×3 IMPLANT
TRAY FOLEY MTR SLVR 16FR STAT (SET/KITS/TRAYS/PACK) IMPLANT

## 2020-10-25 NOTE — Transfer of Care (Signed)
Immediate Anesthesia Transfer of Care Note  Patient: Bonnie Williams  Procedure(s) Performed: POST PARTUM TUBAL LIGATION (Bilateral Abdomen)  Patient Location: PACU  Anesthesia Type:Spinal  Level of Consciousness: drowsy  Airway & Oxygen Therapy: Patient Spontanous Breathing and Patient connected to face mask oxygen  Post-op Assessment: Report given to RN and Post -op Vital signs reviewed and stable  Post vital signs: Reviewed and stable  Last Vitals:  Vitals Value Taken Time  BP 97/63 10/25/20 1531  Temp 36.3 C 10/25/20 1530  Pulse 66 10/25/20 1533  Resp 13 10/25/20 1533  SpO2 100 % 10/25/20 1533  Vitals shown include unvalidated device data.  Last Pain:  Vitals:   10/25/20 1530  TempSrc:   PainSc: Asleep         Complications: No complications documented.

## 2020-10-25 NOTE — Anesthesia Procedure Notes (Signed)
Spinal  Patient location during procedure: OR Start time: 10/25/2020 2:15 PM End time: 10/25/2020 2:30 PM Staffing Performed: resident/CRNA  Resident/CRNA: Johney Maine D, CRNA Preanesthetic Checklist Completed: patient identified, IV checked, site marked, risks and benefits discussed, surgical consent, monitors and equipment checked, pre-op evaluation and timeout performed Spinal Block Patient position: sitting Prep: ChloraPrep Patient monitoring: heart rate, continuous pulse ox and blood pressure Approach: midline Location: L3-4 Injection technique: single-shot Needle Needle type: Pencan  Needle gauge: 24 G Needle length: 10 cm Assessment Sensory level: T6

## 2020-10-25 NOTE — Progress Notes (Signed)
Pt transported to OR

## 2020-10-25 NOTE — Progress Notes (Signed)
Called to evaluate patient d/t acute abd pain.  S: reports sharp, stabbing pain in her abdomen that started after her BTL.  Pain is worse on the right side.  O:  BP 118/73 (BP Location: Left Arm)   Pulse 61   Temp 97.6 F (36.4 C) (Oral)   Resp 16   Ht 5' (1.524 m)   Wt 80.3 kg   LMP 02/05/2020   SpO2 97% Comment: Room Air  Breastfeeding Unknown   BMI 34.57 kg/m   Constitutional: Tearful, restless in bed Skin: normal for ethnicity  Abdomen: tender to light palpation -left side palpates soft -right side palpates firmer -right side more tender than left side -bowel sounds normal on left side, hypoactive on right side  Fundus: firm, U/2  A/P  -Postoperative pain not controlled -Discussed assessment with Dr. Elesa Massed.   -Stat CT scan ordered for further assessment of abdominal pain  -Will give mylicon for gas pain  -Antiemetics ordered -Results of CT scan will guide next steps.    Margaretmary Eddy, CNM Certified Nurse Midwife [facility]

## 2020-10-25 NOTE — Progress Notes (Signed)
Pt called for assistance to bathroom, RN and student responded to room. Pt complained of feeling dizzy and lightheaded just after standing up, assisted to chair and provided support until symptoms passed. Midwife came in on rounds and assisted with patient to bathroom, assessed patient orders placed for IV fluids and labs to be rechecked in AM.

## 2020-10-25 NOTE — Progress Notes (Signed)
Post Partum Day 0  Subjective: -nausea  -dizziness with ambulation -feels weak but ok when resting   Doing well while she is resting but reports dizziness and weakness when ambulating.  Has not eaten since admission.  NPO since 0500 for BTL.  Attempted to eat prior to NPO status but was too nauseous.    Attempted to ambulate to bathroom but felt dizzy and weak.  Assisted to chair to rest.  CBG at time was 84.  Able to recover to get to toilet but was unable to void.  Assisted to bed to rest. CNM in room during episode.    No fever/chills, chest pain, shortness of breath, or leg pain. No nipple or breast pain. No headache, visual changes, or RUQ/epigastric pain.  Objective: BP 115/67 (BP Location: Left Arm)   Pulse 66   Temp 98.9 F (37.2 C) (Oral)   Resp 20   Ht 5' (1.524 m)   Wt 80.3 kg   LMP 02/05/2020   SpO2 98%   Breastfeeding Unknown   BMI 34.57 kg/m    Physical Exam:  General: alert, cooperative, no distress and pale Breasts: soft/nontender CV: RRR Pulm: nl effort, CTABL Abdomen: soft, non-tender, active bowel sounds Uterine Fundus: firm Perineum: minimal edema, repair well approximated Lochia: appropriate DVT Evaluation: No evidence of DVT seen on physical exam.  Recent Labs    10/24/20 1528 10/25/20 0636  HGB 9.6* 10.2*  HCT 29.1* 30.6*  WBC 8.8 15.2*  PLT 206 205    Assessment/Plan: 29 y.o. G2P1102 postpartum day # 0  -Continue routine postpartum care -Lactation consult PRN for breastfeeding   -Discussed contraceptive options including implant, IUDs hormonal and non-hormonal, injection, pills/ring/patch, condoms, and NFP. Desires BTL, scheduled for 1330 today.  -Iron deficiency anemia in pregnancy - admission hgb was 9.6 and 10.2 today.  She has not eaten since her admission and suspect that dizziness is due to not eating.  Will recheck CBC in am.  Discussed that if hgb drops significantly or if she shows signs of symptomatic anemia then would recommend  IV iron infusion and/or blood transfusion.  She is ok if either intervention is needed.    -D5LR ordered d/t NPO status and prolonged period of not eating  -Immunization status: Needs flu prior to discharge  Disposition: Continue inpatient postpartum care    LOS: 1 day   Bonnie Williams, CNM 10/25/2020, 10:10 AM   ----- Bonnie Williams  Certified Nurse Midwife [facility]

## 2020-10-25 NOTE — Progress Notes (Signed)
Pt returned from PACU. Infant returned to mom's bedside, currently sleeping.. Bands verified. Mom will call for assistance when baby is awake and ready to feed.

## 2020-10-25 NOTE — Progress Notes (Signed)
S: starting to feel some relief since foley cathter was inserted  O: BP 121/70   Pulse 66   Temp 98 F (36.7 C) (Oral)   Resp 16   Ht 5' (1.524 m)   Wt 80.3 kg   LMP 02/05/2020   SpO2 99%   Breastfeeding Unknown   BMI 34.57 kg/m    Abdomen: tender to palpation but pain is improving Fundus: firm, U/1, small lochia   CBC Latest Ref Rng & Units 10/25/2020 10/25/2020 10/24/2020  WBC 4.0 - 10.5 K/uL 11.9(H) 15.2(H) 8.8  Hemoglobin 12.0 - 15.0 g/dL 10.0(L) 10.2(L) 9.6(L)  Hematocrit 36 - 46 % 30.7(L) 30.6(L) 29.1(L)  Platelets 150 - 400 K/uL 193 205 206     CT ABDOMEN PELVIS WO CONTRAST 10/25/2020  Narrative CLINICAL DATA:  Status post recent bilateral tubal ligation with acute abdominal pain.  EXAM: CT ABDOMEN AND PELVIS WITHOUT CONTRAST  TECHNIQUE: Multidetector CT imaging of the abdomen and pelvis was performed following the standard protocol without IV contrast.  COMPARISON:  None.  FINDINGS: Lower chest: Very mild atelectasis is seen within the posterior aspects of the bilateral lung bases.  Hepatobiliary: No focal liver abnormality is seen. Several subcentimeter gallstones are seen within the gallbladder lumen without evidence of gallbladder wall thickening or biliary dilatation.  Pancreas: Unremarkable. No pancreatic ductal dilatation or surrounding inflammatory changes.  Spleen: Normal in size without focal abnormality.  Adrenals/Urinary Tract: Adrenal glands are unremarkable. Kidneys are normal, without renal calculi, focal lesion, or hydronephrosis. A very small amount of air is seen within the lumen of a moderately distended urinary bladder.  Stomach/Bowel: Stomach is within normal limits. Appendix appears normal. No evidence of bowel wall thickening, distention, or inflammatory changes.  Vascular/Lymphatic: No significant vascular findings are present. No enlarged abdominal or pelvic lymph nodes.  Reproductive: An enlarged postpartum uterus is  seen. A mild-to-moderate amount of heterogeneous increased attenuation is present throughout the endometrial canal.  Other: No abdominal wall hernia or abnormality. No abdominopelvic ascites.  Several small foci of free air are seen within the anterior aspect of the lower abdomen and pelvis.  Musculoskeletal: No acute or significant osseous findings.  IMPRESSION: 1. Enlarged postpartum uterus with a mild-to-moderate amount of heterogeneous increased attenuation throughout the endometrial canal which is suspicious for the presence of blood products. 2. Several small foci of free air within the anterior aspect of the lower abdomen and pelvis, secondary to the patient's recent surgical intervention. 3. Cholelithiasis without evidence of acute cholecystitis.  A/P  -CT results and CBC reviewed with Dr. Elesa Massed -Pain has improved since foley cath was inserted -Will keep foley in tonight, d/c in am -Tylenol and Toradol for pain management  -Breakthrough pain managed with oral and IV opioids  -Mylicon every 6 hours for gas  -Encourage ambulation  -Clear liquids, advance as tolerate   Margaretmary Eddy, CNM Certified Nurse Midwife [place]  [facility]

## 2020-10-25 NOTE — Op Note (Signed)
  Post Partum Tubal Ligation  Indication for procedure: desired permanent sterilization  Pre-op diagnosis: s/p term vaginal delivery, desired permanent sterilization Post-op diagnosis: same Procedure: post partum bilateral tubal ligation Surgeon: Leeroy Bock Adjoa Althouse Assist: Brennan Bailey Anesthesia: spinal IVF: see flowsheet EBL: minimal UOP: none Findings: patent bilateral tubes, normal post-gravid uterine fundus Specimens: portion of left tube with fimbriated end, portion of right tube with fimbriated end Complications: none apparent Disposition: stable to PACU  Procedure in detail: The patient was seen and confirmed desire for permanent sterilization.  She was identified as Bonnie Williams and was brought to the OR.  General anesthesia was administered, and the patient was prepped and draped in the usual sterile fashion.  A surgical time-out was called.  An 11-blade was used to incise the skin in the infraumbilical area.  The subcutaneous tissues were dissected to and then through the fascia, and the peritoneum was grasped and divided.  An alexis retractor was placed in the abdominal cavity and positioned.  The uterus was identified, and the right tube was grasped with a babcock and traced to the fimbriated end.  A kelly clamp was placed in a step-wise fashion along the mesosalpinx and metzenbaums were used to transect the tube from the underlying tissue.  2-0 Vicryl was used to tie the remaining tissue.  The same steps were used on the left side.  All dissected ends were found to be hemostatic.  The alexis retractor was removed, and the fascia was reapproximated with 2-0 vicryl.  The subcutaneous tissue was irrigated and then the skin was closed with 4-0 monocryl.  The skin was covered in surgical glue.  The sponge, needle, and instrument counts were correct x2.  The patient tolerated the procedure and was brought to PACU in a stable condition.  Ranae Plumber, MD Attending Obstetrician  and Gynecologist [facility]

## 2020-10-25 NOTE — Anesthesia Preprocedure Evaluation (Signed)
Anesthesia Evaluation  Patient identified by MRN, date of birth, ID band Patient awake    Reviewed: Allergy & Precautions, NPO status , Patient's Chart, lab work & pertinent test results  History of Anesthesia Complications Negative for: history of anesthetic complications  Airway Mallampati: II       Dental  (+) Teeth Intact   Pulmonary neg pulmonary ROS, neg sleep apnea, neg COPD, Patient abstained from smoking.Not current smoker,           Cardiovascular Exercise Tolerance: Good METS(-) hypertension(-) CAD and (-) Past MI negative cardio ROS  (-) dysrhythmias  - Systolic murmurs    Neuro/Psych negative neurological ROS  negative psych ROS   GI/Hepatic neg GERD  ,(+)     (-) substance abuse  ,   Endo/Other  neg diabetes  Renal/GU negative Renal ROS     Musculoskeletal   Abdominal   Peds  Hematology  (+) anemia ,   Anesthesia Other Findings   Reproductive/Obstetrics (+) Pregnancy                             Anesthesia Physical  Anesthesia Plan  ASA: II and emergent  Anesthesia Plan: Spinal   Post-op Pain Management:    Induction:   PONV Risk Score and Plan:   Airway Management Planned: Nasal Cannula  Additional Equipment:   Intra-op Plan:   Post-operative Plan:   Informed Consent: I have reviewed the patients History and Physical, chart, labs and discussed the procedure including the risks, benefits and alternatives for the proposed anesthesia with the patient or authorized representative who has indicated his/her understanding and acceptance.       Plan Discussed with: Surgeon  Anesthesia Plan Comments:         Anesthesia Quick Evaluation

## 2020-10-25 NOTE — Lactation Note (Signed)
This note was copied from a baby's chart. Lactation Consultation Note  Patient Name: Bonnie Williams [id number] Date: 10/25/2020 Reason for consult: Initial assessment;1st time breastfeeding;Mother's request;Other (Comment) (previous difficulty, was unsuccessful the 1st time)  Lactation to the room for initial visit. Mother is holding the baby. Encouraged feeding on demand and with cues. If baby is not cueing encouraged hand expression and skin to skin. Taught proper technique for hand expression and filled vial about 4-5 ml's for baby when Mother goes for BTL.  Baby had just completed a feed and was asleep. Discussed with Mother and if baby needs more supplement than what she expressed she would like DBM used. Encouraged 8 or more attempts in the first 24 hours and 8 or more good feeds after 24 HOL. Reviewed appropriate diapers for days of life and How to know your baby is getting enough to eat. Reviewed "Understanding Postpartum and Newborn Care" booklet at bedside. [facility] # left on board, encouraged to call for any assistance. Mother has no further questions at this time.   Maternal Data Formula Feeding for Exclusion: No Has patient been taught Hand Expression?: Yes Does the patient have breastfeeding experience prior to this delivery?: Yes  Feeding Feeding Type: Breast Fed   Interventions Interventions: Breast feeding basics reviewed;Hand express  Lactation Tools Discussed/Used     Consult Status Consult Status: Follow-up Date: 10/26/20 Follow-up type: Call as needed    Cortina Vultaggio D Jenyfer Trawick 10/25/2020, 12:00 PM

## 2020-10-25 NOTE — Discharge Summary (Signed)
Obstetrical Discharge Summary  Patient Name: Bonnie Williams DOB: [birth year] MRN: [id number]  Date of Admission: 10/24/2020 Date of Delivery: 10/25/2020 Delivered by: Ranae Plumber, MD Date of Discharge: 10/26/2020  Primary OB: Phineas Real  [id number] last menstrual period was 02/05/2020. EDC Estimated Date of Delivery: 11/11/20 Gestational Age at Delivery: [redacted]w[redacted]d   Antepartum complications:  1. Hx Preterm birth at 40wks with G1 2. Hx Pre-eclampsia with G1 3. Elevated 1hr GTT 232, but passed 3hr GTT 4. Uterine S>D, growth [place] by MFM, EFW 91%ile, 2706g at [redacted]w[redacted]d  Admitting Diagnosis: SROM >24hrs Secondary Diagnosis: Patient Active Problem List   Diagnosis Date Noted  . Prolonged antepartum rupture of membranes 10/24/2020  . Encounter for procreative genetic counseling 05/07/2020    Augmentation: AROM and Pitocin Complications: None Intrapartum complications/course: Mom presented to L&D with prolonged SROM, augmented with pitocin and AROM of forebag.  epidual placed. Progressed to complete, second stage: <10 minutes. Delivery of fetal head with restitution to LOT.   Anterior then posterior shoulders delivered without difficulty.  Baby placed on mom's chest, and attended to by peds.   Cord was then clamped and cut when pulseless by FOB.  Placenta spontaneously delivered, intact.   IV pitocin given for hemorrhage prophylaxis. Thin, brisk bleeding in bursts, methergine IM given.  Shallow 1st degree repaired with 3-0 vicryl rapide in typical fashion. Delivery Type: spontaneous vaginal delivery Anesthesia: epidural Placenta: spontaneous Laceration: 1st degree perineal Episiotomy: none Newborn Data: Live born female  Birth Weight:  8lbs 8.5oz, 3870g APGAR: 8, 9  Newborn Delivery   Birth date/time: 10/25/2020 00:12:00 Delivery type: Vaginal, Spontaneous     Postpartum Procedures:  Postpartum sterilization [place]: No flowsheet data found.   Post partum course:   Patient had a postpartum course complicated by post-op pain and distended bladder.  A foley cath was placed for of clear urine following BTL.  Her pain improved overnight and foley was d/c.  By time of discharge on PPD#1, her pain was controlled on oral pain medications; she had appropriate lochia and was ambulating, voiding without difficulty and tolerating regular diet.  She was deemed stable for discharge to home.      Discharge Physical Exam:  BP 126/76 (BP Location: Left Arm)   Pulse 81   Temp 98.4 F (36.9 C) (Oral)   Resp 20   Ht 5' (1.524 m)   Wt 80.3 kg   LMP 02/05/2020   SpO2 99%   Breastfeeding Unknown   BMI 34.57 kg/m   General: NAD CV: RRR Pulm: CTABL, nl effort ABD: s/nd/nt, fundus firm and below the umbilicus Lochia: moderate Incision: c/d/I DVT Evaluation: LE non-ttp, no evidence of DVT on exam.  Hemoglobin  Date Value Ref Range Status  10/26/2020 9.1 (L) 12.0 - 15.0 g/dL Final   HCT  Date Value Ref Range Status  10/26/2020 27.7 (L) 36 - 46 % Final     Disposition: stable, discharge to home. Baby Feeding: breastmilk Baby Disposition: home with mom  Rh Immune globulin given: n/a Rubella vaccine given: n/a Flu vaccine given in AP or PP setting: AP  Contraception: BTL  Prenatal Labs:  Blood type/Rh  O pos  Antibody screen neg  Rubella Immune  Varicella Immune  RPR NR  HBsAg Neg  HIV NR  GC neg  Chlamydia neg  Genetic screening negative  1 hour GTT  232  3 hour GTT  [id number]  GBS  negative      Plan:  Ronnald Collum was discharged  to home in good condition. Follow-up appointment with delivering provider in 6 weeks.  Discharge Medications: Allergies as of 10/26/2020      Reactions   Sulfa Antibiotics Swelling, Rash      Medication List    STOP taking these medications   aspirin 81 MG chewable tablet   metoCLOPramide 10 MG tablet Commonly known as: REGLAN     TAKE these medications   acetaminophen 500  MG tablet Commonly known as: TYLENOL Take 2 tablets (1,000 mg total) by mouth every 6 (six) hours as needed (for pain scale < 4).   ferrous sulfate 325 (65 FE) MG tablet Take 1 tablet (325 mg total) by mouth daily.   ibuprofen 600 MG tablet Commonly known as: ADVIL Take 1 tablet (600 mg total) by mouth every 6 (six) hours as needed for mild pain or moderate pain.   oxyCODONE 5 MG immediate release tablet Commonly known as: Oxy IR/ROXICODONE Take 1-2 tablets (5-10 mg total) by mouth every 4 (four) hours as needed for up to 5 days for moderate pain or severe pain.   prenatal multivitamin Tabs tablet Take 1 tablet by mouth daily at 12 noon.        Follow-up Information    Ward, Elenora Fender, MD. Schedule an appointment as soon as possible for a visit in 2 week(s).   Specialty: Obstetrics and Gynecology Why: post-op check.  This can be a video visit if preferred.  Contact information: [address] [place] [address] [phone]               Signed:  Margaretmary Eddy, CNM Certified Nurse Midwife [place]  [facility]

## 2020-10-26 LAB — CBC
HCT: 27.7 % — ABNORMAL LOW (ref 36.0–46.0)
Hemoglobin: 9.1 g/dL — ABNORMAL LOW (ref 12.0–15.0)
MCH: 25.1 pg — ABNORMAL LOW (ref 26.0–34.0)
MCHC: 32.9 g/dL (ref 30.0–36.0)
MCV: 76.3 fL — ABNORMAL LOW (ref 80.0–100.0)
Platelets: 197 10*3/uL (ref 150–400)
RBC: 3.63 MIL/uL — ABNORMAL LOW (ref 3.87–5.11)
RDW: 13.2 % (ref 11.5–15.5)
WBC: 11.3 10*3/uL — ABNORMAL HIGH (ref 4.0–10.5)
nRBC: 0 % (ref 0.0–0.2)

## 2020-10-26 LAB — COMPREHENSIVE METABOLIC PANEL
ALT: 8 U/L (ref 0–44)
AST: 13 U/L — ABNORMAL LOW (ref 15–41)
Albumin: 2.2 g/dL — ABNORMAL LOW (ref 3.5–5.0)
Alkaline Phosphatase: 98 U/L (ref 38–126)
Anion gap: 6 (ref 5–15)
BUN: 7 mg/dL (ref 6–20)
CO2: 25 mmol/L (ref 22–32)
Calcium: 8.2 mg/dL — ABNORMAL LOW (ref 8.9–10.3)
Chloride: 106 mmol/L (ref 98–111)
Creatinine, Ser: 0.55 mg/dL (ref 0.44–1.00)
GFR, Estimated: >60 mL/min (ref >60)
Glucose, Bld: 88 mg/dL (ref 70–99)
Potassium: 4.1 mmol/L (ref 3.5–5.1)
Sodium: 137 mmol/L (ref 135–145)
Total Bilirubin: 0.4 mg/dL (ref 0.3–1.2)
Total Protein: 5.3 g/dL — ABNORMAL LOW (ref 6.5–8.1)

## 2020-10-26 MED ORDER — OXYCODONE HCL 5 MG PO TABS
5.0000 mg | ORAL_TABLET | ORAL | 0 refills | Status: AC | PRN
Start: 2020-10-26 — End: 2020-10-31

## 2020-10-26 MED ORDER — ACETAMINOPHEN 500 MG PO TABS: 1000.0000 mg | ORAL_TABLET | Freq: Four times a day (QID) | ORAL | 0 refills | Status: DC | PRN

## 2020-10-26 MED ORDER — IBUPROFEN 600 MG PO TABS: 600.0000 mg | ORAL_TABLET | Freq: Four times a day (QID) | ORAL | 1 refills | Status: DC | PRN

## 2020-10-26 MED ORDER — FERROUS SULFATE 325 (65 FE) MG PO TABS: 325.0000 mg | ORAL_TABLET | Freq: Every day | ORAL | 11 refills | Status: DC

## 2020-10-26 NOTE — Progress Notes (Signed)
Post Partum Day 1  Subjective: resting in bed  Feeling better than yesterday, pain is better controlled but still having some with movement. Tolerating clear liquids. Foley in place.  No fever/chills, chest pain, shortness of breath, nausea/vomiting, or leg pain. No nipple or breast pain. No headache, visual changes, or RUQ/epigastric pain.  Objective: BP (!) 100/59 (BP Location: Right Arm)   Pulse 74   Temp 98.3 F (36.8 C) (Oral)   Resp 18   Ht 5' (1.524 m)   Wt 80.3 kg   LMP 02/05/2020   SpO2 97%   Breastfeeding Unknown   BMI 34.57 kg/m    Physical Exam:  General: alert, cooperative and no distress Breasts: soft/nontender  CV: RRR Pulm: nl effort, CTABL Abdomen: soft, non-tender, active bowel sounds Uterine Fundus: firm Perineum: minimal edema, repair well approximated Lochia: appropriate DVT Evaluation: No evidence of DVT seen on physical exam.  Recent Labs    10/25/20 1951 10/26/20 0533  HGB 10.0* 9.1*  HCT 30.7* 27.7*  WBC 11.9* 11.3*  PLT 193 197    Assessment/Plan: 29 y.o. G2P1102 postpartum day # \  -Continue routine postpartum care -Lactation consult PRN for breastfeeding  -S/P BTL - pain better controlled  -Iron deficiency anemia - hemodynamically stable and asymptomatic; continue PO ferrous sulfate BID with stool softeners  -Will d/c foley and ambulate around unit -Immunization status: Needs flu prior to discharge  Disposition: Desires discharge home today   LOS: 2 days   Gustavo Lah, CNM 10/26/2020, 7:32 AM   ----- Margaretmary Eddy  Certified Nurse Midwife [facility] [place]

## 2020-10-26 NOTE — Progress Notes (Signed)
Notified Margaretmary Eddy, CNM to update her on urine output of 800 immediately in catheter. Fundus firm, midline, -1-2, no bleeding, orders for simethicone q6, toradol IV over night, and keep foley in overnight.

## 2020-10-26 NOTE — Progress Notes (Signed)
Called Margaretmary Eddy, CNM to update her on bladder scan result and discuss indwelling catheter vs in and out. Orders for indwelling catheter.

## 2020-10-26 NOTE — Lactation Note (Signed)
This note was copied from a baby's chart. Lactation Consultation Note  Patient Name: Girl Braya Habermehl [id number] Date: 10/26/2020 Reason for consult: Follow-up assessment  Lactation to the room for initial visit. Father is holding the baby. Encouraged feeding on demand and with cues. If baby is not cueing encouraged hand expression and skin to skin no more than 3 hours from last feed start time.  Encouraged 8 or more good feeds after 24 HOL. Reviewed appropriate diapers for days of life and How to know your baby is getting enough to eat. Reviewed "Understanding Postpartum and Newborn Care" booklet at bedside. [facility] # left on board, encouraged to call for any assistance. Comfort gels and coconut oil at bedside. Encouraged to call for LC to assess the feed and positioning. Gave and reviewed manual pump. Mother is planning on returning to work after 6 weeks and will speak to [facility] about electric pump. LC # on board. Mother has no further questions at this time.   Lactation Tools Discussed/Used WIC Program: Yes Pump Review: Setup, frequency, and cleaning;Milk Storage   Consult Status      Lateefah Mallery D Kandas Oliveto 10/26/2020, 11:13 AM

## 2020-10-26 NOTE — Progress Notes (Signed)
Margaretmary Eddy, CNM in room assessing patient and discussing plan of care.

## 2020-10-26 NOTE — Progress Notes (Signed)
Reviewed D/C instructions with pt and family. Pt verbalized understanding of teaching. Discharged to home via W/C. Pt to schedule f/u appt.  

## 2020-10-26 NOTE — Lactation Note (Signed)
This note was copied from a baby's chart. Lactation Consultation Note  Patient Name: Girl Miski Feldpausch [id number] Date: 10/26/2020   Lactation Rounds: Mother has sore nipples and has requested a nipple shield. LC the the room to review how to properly place nipple shield. LC taught how to invert shield and place over breast. We currently do not have any 20 mm shield in stock so Mother was given a 24 mm shield. Discussed again baby's gestational age and if she is not waking and feeding we encourage pump and supplement. Baby has had 6 voids and 2 stools in the last 24 HOL. LC assisted with attempting to latch but baby was sleepy. Mother was encouraged to undress her and HS/spoon feed colostrum to attempt to wake her more.   Feeding Feeding Type: Breast Fed  LATCH Score Latch: Too sleepy or reluctant, no latch achieved, no sucking elicited. (Baby is sleepy and not attempting to latch)  Audible Swallowing: None  Type of Nipple: Everted at rest and after stimulation  Comfort (Breast/Nipple): Soft / non-tender  Hold (Positioning): Assistance needed to correctly position infant at breast and maintain latch.  LATCH Score: 5  Interventions Interventions: Breast feeding basics reviewed;Assisted with latch (discussed HE/spoon feed and pumping if no latch achieved)  Lactation Tools Discussed/Used     Consult Status Consult Status: PRN Follow-up type: Call as needed    Bryden Darden D Dejana Pugsley 10/26/2020, 3:21 PM

## 2020-10-26 NOTE — Discharge Instructions (Signed)
Vaginal Delivery, Care After Refer to this sheet in the next few weeks. These discharge instructions provide you with information on caring for yourself after delivery. Your caregiver may also give you specific instructions. Your treatment has been planned according to the most current medical practices available, but problems sometimes occur. Call your caregiver if you have any problems or questions after you go home. HOME CARE INSTRUCTIONS 1. Take over-the-counter or prescription medicines only as directed by your caregiver or pharmacist. 2. Do not drink alcohol, especially if you are breastfeeding or taking medicine to relieve pain. 3. Do not smoke tobacco. 4. Continue to use good perineal care. Good perineal care includes: 1. Wiping your perineum from back to front 2. Keeping your perineum clean. 3. You can do sitz baths twice a day, to help keep this area clean 5. Do not use tampons, douche or have sex until your caregiver says it is okay. 6. Shower only and avoid sitting in submerged water, aside from sitz baths 7. Wear a well-fitting bra that provides breast support. 8. Eat healthy foods. 9. Drink enough fluids to keep your urine clear or pale yellow. 10. Eat high-fiber foods such as whole grain cereals and breads, brown rice, beans, and fresh fruits and vegetables every day. These foods may help prevent or relieve constipation. 11. Avoid constipation with high fiber foods or medications, such as miralax or metamucil 12. Follow your caregiver's recommendations regarding resumption of activities such as climbing stairs, driving, lifting, exercising, or traveling. 13. Talk to your caregiver about resuming sexual activities. Resumption of sexual activities is dependent upon your risk of infection, your rate of healing, and your comfort and desire to resume sexual activity. 14. Try to have someone help you with your household activities and your newborn for at least a few days after you leave  the hospital. 15. Rest as much as possible. Try to rest or take a nap when your newborn is sleeping. 16. Increase your activities gradually. 17. Keep all of your scheduled postpartum appointments. It is very important to keep your scheduled follow-up appointments. At these appointments, your caregiver will be checking to make sure that you are healing physically and emotionally. SEEK MEDICAL CARE IF:   You are passing large clots from your vagina. Save any clots to show your caregiver.  You have a foul smelling discharge from your vagina.  You have trouble urinating.  You are urinating frequently.  You have pain when you urinate.  You have a change in your bowel movements.  You have increasing redness, pain, or swelling near your vaginal incision (episiotomy) or vaginal tear.  You have pus draining from your episiotomy or vaginal tear.  Your episiotomy or vaginal tear is separating.  You have painful, hard, or reddened breasts.  You have a severe headache.  You have blurred vision or see spots.  You feel sad or depressed.  You have thoughts of hurting yourself or your newborn.  You have questions about your care, the care of your newborn, or medicines.  You are dizzy or light-headed.  You have a rash.  You have nausea or vomiting.  You were breastfeeding and have not had a menstrual period within 12 weeks after you stopped breastfeeding.  You are not breastfeeding and have not had a menstrual period by the 12th week after delivery.  You have a fever. SEEK IMMEDIATE MEDICAL CARE IF:   You have persistent pain.  You have chest pain.  You have shortness of breath.    You faint.  You have leg pain.  You have stomach pain.  Your vaginal bleeding saturates two or more sanitary pads in 1 hour. MAKE SURE YOU:   Understand these instructions.  Will watch your condition.  Will get help right away if you are not doing well or get worse. Document Released:  11/19/2000 Document Revised: 04/08/2014 Document Reviewed: 07/19/2012 [facility] Patient Information 2015 [place], [place]. This information is not intended to replace advice given to you by your health care provider. Make sure you discuss any questions you have with your health care provider.  Sitz Bath A sitz bath is a warm water bath taken in the sitting position. The water covers only the hips and butt (buttocks). We recommend using one that fits in the toilet, to help with ease of use and cleanliness. It may be used for either healing or cleaning purposes. Sitz baths are also used to relieve pain, itching, or muscle tightening (spasms). The water may contain medicine. Moist heat will help you heal and relax.  HOME CARE  Take 3 to 4 sitz baths a day. 18. Fill the bathtub half-full with warm water. 19. Sit in the water and open the drain a little. 20. Turn on the warm water to keep the tub half-full. Keep the water running constantly. 21. Soak in the water for 15 to 20 minutes. 22. After the sitz bath, pat the affected area dry. GET HELP RIGHT AWAY IF: You get worse instead of better. Stop the sitz baths if you get worse. MAKE SURE YOU:  Understand these instructions.  Will watch your condition.  Will get help right away if you are not doing well or get worse. Document Released: 12/30/2004 Document Revised: 08/16/2012 Document Reviewed: 03/22/2011 [facility] Patient Information 2015 [place], [place]. This information is not intended to replace advice given to you by your health care provider. Make sure you discuss any questions you have with your health care provider.    Postpartum Tubal Ligation, Care After This sheet gives you information about how to care for yourself after your procedure. Your health care provider may also give you more specific instructions. If you have problems or questions, contact your health care provider. What can I expect after the procedure? After the procedure,  you may have: A sore throat. Bruising or pain in your back. Nausea or vomiting. Dizziness. Mild abdominal discomfort or pain, such as cramping, gas pain, or feeling bloated. Soreness around the incision area. Tiredness. Pain in your shoulders. Follow these instructions at home: Medicines Ask your health care provider if the medicine prescribed to you: Requires you to avoid driving or using heavy machinery. Can cause constipation. You may need to take actions to prevent or treat constipation, such as: Drink enough fluid to keep your urine pale yellow. Take over-the-counter or prescription medicines. Eat foods that are high in fiber, such as beans, whole grains, and fresh fruits and vegetables. Limit foods that are high in fat and processed sugars, such as fried or sweet foods. Do not take aspirin because it can cause bleeding. Activity Rest for the remainder of the day. Return to your normal activities as told by your health care provider. Ask your health care provider what activities are safe for you. Do not have sex, douche, or put a tampon or anything else in your vagina for 6 weeks or as long as told by your health care provider. Do not lift anything that is heavier than your baby for 2 weeks, or the limit  that you are told, until your health care provider says that it is safe. Incision care     Follow instructions from your health care provider about how to take care of your incision. Make sure you: Wash your hands with soap and water before and after you change your bandage (dressing). If soap and water are not available, use hand sanitizer. Change your dressing as told by your health care provider. Leave stitches (sutures), skin glue, or adhesive strips in place. These skin closures may need to stay in place for 2 weeks or longer. If adhesive strip edges start to loosen and curl up, you may trim the loose edges. Do not remove adhesive strips completely unless your health care  provider tells you to do that. Check your incision area every day for signs of infection. Check for: Redness, swelling, or pain. Fluid or blood. Warmth. Pus or a bad smell. Other Instructions Do not take baths, swim, or use a hot tub until your health care provider approves. Ask your health care provider if you may take showers. You may only be allowed to take sponge baths. Keep all follow-up visits as told by your health care provider. This is important. Contact a health care provider if: You have redness, swelling, or pain around your incision. Your incision feels warm to the touch. Your pain does not improve after 2-3 days. You have a rash. You repeatedly become dizzy or lightheaded. Your pain medicine is not helping. You are constipated. Get help right away if you: Have a fever. Faint. Have pain in your abdomen that gets worse. Have fluid or blood coming from your incision. You have pus or a bad smell coming from your incision. The edges of your incision break open after the sutures have been removed. Have shortness of breath or trouble breathing. Have chest pain or leg pain. Have ongoing nausea or diarrhea. Summary Mild abdominal discomfort is common after this procedure. Contact your health care provider if you experience problems or have concerns. Do not lift anything that is heavier than your baby for 2 weeks, or the limit that you are told, until your health care provider says that it is safe. Keep all follow-up visits as told by your health care provider. This is important. This information is not intended to replace advice given to you by your health care provider. Make sure you discuss any questions you have with your health care provider. Document Revised: 05/07/2019 Document Reviewed: 10/12/2018 Elsevier Patient Education  2020 [employer].

## 2020-10-26 NOTE — Anesthesia Postprocedure Evaluation (Signed)
Anesthesia Post Note  Patient: Bonnie Williams  Procedure(s) Performed: AN AD HOC LABOR EPIDURAL  Patient location during evaluation: Mother Baby Anesthesia Type: Epidural Level of consciousness: awake and alert Pain management: pain level controlled Vital Signs Assessment: post-procedure vital signs reviewed and stable Respiratory status: spontaneous breathing, nonlabored ventilation and respiratory function stable Cardiovascular status: stable Postop Assessment: no headache, no backache and epidural receding Anesthetic complications: no   No complications documented.   Last Vitals:  Vitals:   10/26/20 0813 10/26/20 1151  BP: 106/66 126/76  Pulse: 86 81  Resp: 20 20  Temp: 36.8 C 36.9 C  SpO2: 99%     Last Pain:  Vitals:   10/26/20 1151  TempSrc: Oral  PainSc:                  Luise Yamamoto K

## 2020-10-26 NOTE — Progress Notes (Signed)
Geri Seminole, CNM called to review CT result. Orders for bladder scan and in and out cath.

## 2020-10-26 NOTE — Anesthesia Postprocedure Evaluation (Signed)
Anesthesia Post Note  Patient: Bonnie Williams  Procedure(s) Performed: POST PARTUM TUBAL LIGATION (Bilateral Abdomen)  Patient location during evaluation: Mother Baby Anesthesia Type: Spinal Level of consciousness: oriented and awake and alert Pain management: pain level controlled Vital Signs Assessment: post-procedure vital signs reviewed and stable Respiratory status: spontaneous breathing and respiratory function stable Cardiovascular status: stable Postop Assessment: no headache, no backache, no apparent nausea or vomiting and able to ambulate Anesthetic complications: no   No complications documented.   Last Vitals:  Vitals:   10/26/20 0813 10/26/20 1151  BP: 106/66 126/76  Pulse: 86 81  Resp: 20 20  Temp: 36.8 C 36.9 C  SpO2: 99%     Last Pain:  Vitals:   10/26/20 1151  TempSrc: Oral  PainSc:                  Brookes Craine K

## 2020-10-27 ENCOUNTER — Ambulatory Visit: Payer: Medicaid Other

## 2020-10-27 ENCOUNTER — Encounter: Payer: Self-pay | Admitting: Obstetrics & Gynecology

## 2020-10-28 ENCOUNTER — Ambulatory Visit: Payer: Medicaid Other

## 2020-10-28 LAB — SURGICAL PATHOLOGY

## 2021-04-14 ENCOUNTER — Ambulatory Visit
Admission: RE | Admit: 2021-04-14 | Discharge: 2021-04-14 | Disposition: A | Discharge status: Home / Self Care | Payer: Medicaid Other | Source: Ambulatory Visit | Attending: Family Medicine | Admitting: Family Medicine

## 2021-04-14 ENCOUNTER — Other Ambulatory Visit: Payer: Self-pay | Admitting: Family Medicine

## 2021-04-14 DIAGNOSIS — M419 Scoliosis, unspecified: Secondary | ICD-10-CM | POA: Insufficient documentation

## 2021-05-24 ENCOUNTER — Other Ambulatory Visit: Payer: Self-pay

## 2021-05-24 ENCOUNTER — Emergency Department
Admission: EM | Admit: 2021-05-24 | Discharge: 2021-05-24 | Disposition: A | Discharge status: Home / Self Care | Payer: Medicaid Other | Attending: Emergency Medicine | Admitting: Emergency Medicine

## 2021-05-24 DIAGNOSIS — T7840XA Allergy, unspecified, initial encounter: Secondary | ICD-10-CM | POA: Diagnosis present

## 2021-05-24 DIAGNOSIS — R197 Diarrhea, unspecified: Secondary | ICD-10-CM | POA: Diagnosis not present

## 2021-05-24 DIAGNOSIS — L509 Urticaria, unspecified: Secondary | ICD-10-CM | POA: Insufficient documentation

## 2021-05-24 LAB — CBG MONITORING, ED: Glucose-Capillary: 152 mg/dL — ABNORMAL HIGH (ref 70–99)

## 2021-05-24 MED ORDER — FAMOTIDINE 20 MG PO TABS: 20.0000 mg | ORAL_TABLET | Freq: Two times a day (BID) | ORAL | 0 refills | Status: DC

## 2021-05-24 MED ORDER — DIPHENHYDRAMINE HCL 50 MG/ML IJ SOLN: 50.0000 mg | Freq: Once | INTRAMUSCULAR | Status: AC

## 2021-05-24 MED ORDER — EPINEPHRINE 0.3 MG/0.3ML IJ SOAJ: 0.3000 mg | INTRAMUSCULAR | 1 refills | Status: AC | PRN

## 2021-05-24 MED ORDER — METHYLPREDNISOLONE SODIUM SUCC 125 MG IJ SOLR
INTRAMUSCULAR | Status: AC
  Administered 2021-05-24: 02:00:00 125 mg via INTRAVENOUS
  Filled 2021-05-24: qty 2

## 2021-05-24 MED ORDER — METHYLPREDNISOLONE SODIUM SUCC 125 MG IJ SOLR: 125.0000 mg | Freq: Once | INTRAMUSCULAR | Status: AC

## 2021-05-24 MED ORDER — EPINEPHRINE 0.3 MG/0.3ML IJ SOAJ: 0.3000 mg | Freq: Once | INTRAMUSCULAR | Status: AC

## 2021-05-24 MED ORDER — DIPHENHYDRAMINE HCL 50 MG/ML IJ SOLN
INTRAMUSCULAR | Status: AC
  Administered 2021-05-24: 02:00:00 50 mg via INTRAVENOUS
  Filled 2021-05-24: qty 1

## 2021-05-24 MED ORDER — FAMOTIDINE IN NACL 20-0.9 MG/50ML-% IV SOLN
20.0000 mg | Freq: Once | INTRAVENOUS | Status: AC
  Administered 2021-05-24: 02:00:00 20 mg via INTRAVENOUS

## 2021-05-24 MED ORDER — EPINEPHRINE 0.3 MG/0.3ML IJ SOAJ
INTRAMUSCULAR | Status: AC
  Administered 2021-05-24: 02:00:00 0.3 mg via INTRAMUSCULAR
  Filled 2021-05-24: qty 0.3

## 2021-05-24 MED ORDER — PREDNISONE 10 MG (21) PO TBPK: ORAL_TABLET | ORAL | 0 refills | Status: DC

## 2021-05-24 MED ORDER — SODIUM CHLORIDE 0.9 % IV BOLUS (SEPSIS)
1000.0000 mL | Freq: Once | INTRAVENOUS | Status: AC
  Administered 2021-05-24: 08:00:00 1000 mL via INTRAVENOUS

## 2021-05-24 MED ORDER — SODIUM CHLORIDE 0.9 % IV BOLUS (SEPSIS)
1000.0000 mL | Freq: Once | INTRAVENOUS | Status: AC
  Administered 2021-05-24: 02:00:00 1000 mL via INTRAVENOUS

## 2021-05-24 NOTE — Discharge Instructions (Addendum)
You may use over-the-counter Benadryl 50 mg every 6 hours as needed for itching, rash.

## 2021-05-24 NOTE — ED Notes (Signed)
Verbal consent for discharge given. signautre pad not working.

## 2021-05-24 NOTE — ED Notes (Signed)
Pt requested to go to the bathroom. Upon standing patient was very dizzy and unable to walk to the commode. Pt placed in wheelchair and taken to bathroom. Pt instructed to pull red cord when finished,

## 2021-05-24 NOTE — ED Notes (Signed)
Pt returned to stretcher and resting comfortably.

## 2021-05-24 NOTE — ED Triage Notes (Signed)
Pt withhives noted to arms, legs, shoulder. Pt with swollen lips and is clearing throat. Pt states began two days ago but has worsened in last few hours.

## 2021-05-24 NOTE — ED Provider Notes (Signed)
[facility] Emergency Department Provider Note  ____________________________________________   Event Date/Time   First MD Initiated Contact with Patient 05/24/21 0216     (approximate)  I have reviewed the triage vital signs and the nursing notes.   HISTORY  Chief Complaint Allergic Reaction    HPI Bonnie Williams is a 30 y.o. female who presents to the emergency department with an allergic reaction.  States she started having hives about 24 hours ago.  Has now had lip swelling, scratchy throat and feels short of breath.  No wheezing, lightheadedness.  No new exposures.  Not on any medications.        Past Medical History:  Diagnosis Date   Anemia     Patient Active Problem List   Diagnosis Date Noted   Prolonged antepartum rupture of membranes 10/24/2020   Encounter for procreative genetic counseling 05/07/2020    Past Surgical History:  Procedure Laterality Date   TUBAL LIGATION Bilateral 10/25/2020   Procedure: POST PARTUM TUBAL LIGATION;  Surgeon: Steffon Gladu, Elenora Fender, MD;  Location: [facility] ORS;  Service: Gynecology;  Laterality: Bilateral;    Prior to Admission medications   Medication Sig Start Date End Date Taking? Authorizing Provider  EPINEPHrine 0.3 mg/0.3 mL IJ SOAJ injection Inject 0.3 mg into the muscle as needed for anaphylaxis. 05/24/21  Yes Dajana Gehrig, Layla Maw, DO  famotidine (PEPCID) 20 MG tablet Take 1 tablet (20 mg total) by mouth 2 (two) times daily for 6 days. 05/24/21 05/30/21 Yes Vicke Plotner, Layla Maw, DO  predniSONE (STERAPRED UNI-PAK 21 TAB) 10 MG (21) TBPK tablet Take as directed 05/24/21  Yes Katera Rybka, Layla Maw, DO    Allergies Sulfa antibiotics  No family history on file.  Social History Social History   Tobacco Use   Smoking status: Never   Smokeless tobacco: Never  Vaping Use   Vaping Use: Former  Substance Use Topics   Alcohol use: Not Currently   Drug use: Never    Review of Systems Constitutional: No  fever. Eyes: No visual changes. ENT: No sore throat. Cardiovascular: Denies chest pain. Respiratory: + shortness of breath. Gastrointestinal: No nausea, vomiting, diarrhea. Genitourinary: Negative for dysuria. Musculoskeletal: Negative for back pain. Skin: + Hives Neurological: Negative for focal weakness or numbness.  ____________________________________________   PHYSICAL EXAM:  VITAL SIGNS: Today's Vitals   05/24/21 0232 05/24/21 0302 05/24/21 0439 05/24/21 0641  BP: 110/77  113/79 102/70  Pulse: (!) 112  84 90  Resp: 20  16 16   Temp:  98.3 F (36.8 C)    TempSrc:  Oral    SpO2: 100%  96% 96%  Weight: 63.5 kg     Height: 4\' 11"  (1.499 m)     PainSc:    0-No pain   Body mass index is 28.28 kg/m.  CONSTITUTIONAL: Alert and oriented and responds appropriately to questions. Well-appearing; well-nourished HEAD: Normocephalic EYES: Conjunctivae clear, pupils appear equal, EOM appear intact ENT: normal nose; moist mucous membranes, patient has mild swelling noted to the upper lip worse on the right side than the left, posterior oropharynx is patent, normal phonation, no stridor, no trismus or drooling NECK: Supple, normal ROM CARD: Regular and tachycardic; S1 and S2 appreciated; no murmurs, no clicks, no rubs, no gallops RESP: Normal chest excursion without splinting or tachypnea; breath sounds clear and equal bilaterally; no wheezes, no rhonchi, no rales, no hypoxia or respiratory distress, speaking full sentences ABD/GI: Normal bowel sounds; non-distended; soft, non-tender, no rebound, no guarding, no peritoneal  signs, no hepatosplenomegaly BACK: The back appears normal EXT: Normal ROM in all joints; no deformity noted, no edema; no cyanosis SKIN: Normal color for age and race; warm; diffuse urticaria NEURO: Moves all extremities equally PSYCH: The patient's mood and manner are appropriate.  ____________________________________________   LABS (all labs ordered are  listed, but only abnormal results are displayed)  Labs Reviewed  CBG MONITORING, ED - Abnormal; Notable for the following components:      Result Value   Glucose-Capillary 152 (*)    All other components within normal limits   ____________________________________________  EKG   ____________________________________________  RADIOLOGY I, Sherrian Nunnelley, personally viewed and evaluated these images (plain radiographs) as part of my medical decision making, as well as reviewing the written report by the radiologist.  ED MD interpretation:    Official radiology report(s): No results found.  ____________________________________________   PROCEDURES  Procedure(s) performed (including Critical Care):  Procedures  CRITICAL CARE Performed by: Rochele Raring   Total critical care time: 45 minutes  Critical care time was exclusive of separately billable procedures and treating other patients.  Critical care was necessary to treat or prevent imminent or life-threatening deterioration.  Critical care was time spent personally by me on the following activities: development of treatment plan with patient and/or surrogate as well as nursing, discussions with consultants, evaluation of patient's response to treatment, examination of patient, obtaining history from patient or surrogate, ordering and performing treatments and interventions, ordering and review of laboratory studies, ordering and review of radiographic studies, pulse oximetry and re-evaluation of patient's condition.  ____________________________________________   INITIAL IMPRESSION / ASSESSMENT AND PLAN / ED COURSE  As part of my medical decision making, I reviewed the following data within the electronic MEDICAL RECORD NUMBER Nursing notes reviewed and incorporated, Old chart reviewed, and Notes from prior ED visits         Patient here with an allergic reaction.  She has angioedema, diffuse urticaria and feels short of  breath.  Lungs are clear and oxygen saturation 100% on room air.  Posterior oropharynx is patent without signs of swelling.  No known new exposures.  Not on any medications including ACE inhibitors, ARBs.  Will give IV Benadryl, Solu-Medrol, Pepcid, IV fluids and IM epinephrine.  She will need to be observed for 4 hours after receiving these medications.  ED PROGRESS  3:00 AM  Pt reports feeling better.  Hives have resolved.  Angioedema improving.  We will continue to closely monitor.  5:15 AM  Pt continues to be hemodynamically stable, asymptomatic, resting comfortably.  No hypoxia.  No hypotension.  Tachycardia has resolved.  7:00 AM  Pt still has resolution of hives and lip swelling but states she feels shaky, lightheaded with standing and thinks it is because she has not had anything to eat in 24 hours because she was too scared to eat because of the allergic reaction.  Her blood sugar is normal.  Will give second liter of IV fluids and allow her to eat and drink here.  She states she thinks that after she eats she will feel better and be okay for discharge.  Continues to be hemodynamically stable.  Signed out to Dr. Larinda Buttery to reassess patient after continued IV hydration, p.o. challenge.  Discussed with patient that she will need to follow-up with allergy specialist as an outpatient.  Will discharge with prednisone taper, Pepcid, EpiPen.  Discussed using over-the-counter Benadryl as needed.   I reviewed all nursing notes and pertinent previous  records as available.  I have reviewed and interpreted any EKGs, lab and urine results, imaging (as available).  ____________________________________________   FINAL CLINICAL IMPRESSION(S) / ED DIAGNOSES  Final diagnoses:  Allergic reaction, initial encounter     ED Discharge Orders          Ordered    predniSONE (STERAPRED UNI-PAK 21 TAB) 10 MG (21) TBPK tablet        05/24/21 0552    famotidine (PEPCID) 20 MG tablet  2 times daily         05/24/21 0552    EPINEPHrine 0.3 mg/0.3 mL IJ SOAJ injection  As needed        05/24/21 2637            *Please note:  Dawnya Grams was evaluated in Emergency Department on 05/24/2021 for the symptoms described in the history of present illness. She was evaluated in the context of the global COVID-19 pandemic, which necessitated consideration that the patient might be at risk for infection with the SARS-CoV-2 virus that causes COVID-19. Institutional protocols and algorithms that pertain to the evaluation of patients at risk for COVID-19 are in a state of rapid change based on information released by regulatory bodies including the CDC and federal and state organizations. These policies and algorithms were followed during the patient's care in the ED.  Some ED evaluations and interventions may be delayed as a result of limited staffing during and the pandemic.*   Note:  This document was prepared using Dragon voice recognition software and may include unintentional dictation errors.    Derrill Bagnell, Layla Maw, DO 05/24/21 [phone]

## 2021-05-24 NOTE — ED Provider Notes (Signed)
-----------------------------------------   7:09 AM on 05/24/2021 -----------------------------------------  Blood pressure 102/70, pulse 90, temperature 98.3 F (36.8 C), temperature source Oral, resp. rate 16, height 4\' 11"  (1.499 m), weight 63.5 kg, SpO2 96 %, unknown if currently breastfeeding.  Assuming care from Dr. .  In short, Bonnie Williams is a 30 y.o. female with a chief complaint of Allergic Reaction .  Refer to the original H&P for additional details.  The current plan of care is to reassess following treatment for allergic reaction. Patient now dizzy when standing and we will give something to eat along with IVF.  ----------------------------------------- 8:24 AM on 05/24/2021 ----------------------------------------- Patient reports feeling much better following additional IVF and something to eat. No signs of recurrent allergic reaction at this time and she is appropriate for discharge home with PCP and allergist follow-up. She was prescribed epipen and steroids per Dr. 05/26/2021, was counseled to return to the ED for any new or worsening symptoms.    Elesa Massed, MD 05/24/21 [phone]

## 2022-07-30 ENCOUNTER — Ambulatory Visit
Admission: RE | Admit: 2022-07-30 | Discharge: 2022-07-30 | Disposition: A | Discharge status: Home / Self Care | Payer: Medicaid Other | Source: Ambulatory Visit | Attending: Urgent Care | Admitting: Urgent Care

## 2022-07-30 VITALS — BP 130/82 | HR 104 | Temp 98.2°F | Resp 18

## 2022-07-30 DIAGNOSIS — R051 Acute cough: Secondary | ICD-10-CM

## 2022-07-30 DIAGNOSIS — R0981 Nasal congestion: Secondary | ICD-10-CM | POA: Diagnosis not present

## 2022-07-30 DIAGNOSIS — J40 Bronchitis, not specified as acute or chronic: Secondary | ICD-10-CM

## 2022-07-30 DIAGNOSIS — R490 Dysphonia: Secondary | ICD-10-CM

## 2022-07-30 DIAGNOSIS — R062 Wheezing: Secondary | ICD-10-CM | POA: Diagnosis not present

## 2022-07-30 DIAGNOSIS — H9202 Otalgia, left ear: Secondary | ICD-10-CM | POA: Diagnosis not present

## 2022-07-30 DIAGNOSIS — J4520 Mild intermittent asthma, uncomplicated: Secondary | ICD-10-CM

## 2022-07-30 DIAGNOSIS — J329 Chronic sinusitis, unspecified: Secondary | ICD-10-CM

## 2022-07-30 MED ORDER — CETIRIZINE HCL 10 MG PO TABS: 10.0000 mg | ORAL_TABLET | Freq: Every day | ORAL | 0 refills | Status: DC

## 2022-07-30 MED ORDER — PROMETHAZINE-DM 6.25-15 MG/5ML PO SYRP: 5.0000 mL | ORAL_SOLUTION | Freq: Three times a day (TID) | ORAL | 0 refills | Status: DC | PRN

## 2022-07-30 MED ORDER — PREDNISONE 50 MG PO TABS: 50.0000 mg | ORAL_TABLET | Freq: Every day | ORAL | 0 refills | Status: DC

## 2022-07-30 MED ORDER — ALBUTEROL SULFATE HFA 108 (90 BASE) MCG/ACT IN AERS: 2.0000 | INHALATION_SPRAY | RESPIRATORY_TRACT | 0 refills | Status: DC | PRN

## 2022-07-30 NOTE — ED Provider Notes (Signed)
Vivien Rossetti   MRN: [id number] DOB: [birth year]  Subjective:   Bonnie Williams is a 31 y.o. female presenting for 5-day history of acute onset persistent and worsening coughing, congestion, chest congestion, wheezing, shortness of breath, chest pain.  She is also had some sinus congestion, hoarseness of her voice and left ear pain.  Has history of mild intermittent asthma but has not needed an inhaler since she was 31 years old.  Patient is not a smoker.  No current facility-administered medications for this encounter.  Current Outpatient Medications:    EPINEPHrine 0.3 mg/0.3 mL IJ SOAJ injection, Inject 0.3 mg into the muscle as needed for anaphylaxis., Disp: 1 each, Rfl: 1   Allergies  Allergen Reactions   Sulfa Antibiotics Swelling and Rash    Past Medical History:  Diagnosis Date   Anemia      Past Surgical History:  Procedure Laterality Date   TUBAL LIGATION Bilateral 10/25/2020   Procedure: POST PARTUM TUBAL LIGATION;  Surgeon: Ward, Elenora Fender, MD;  Location: [facility] ORS;  Service: Gynecology;  Laterality: Bilateral;    History reviewed. No pertinent family history.  Social History   Tobacco Use   Smoking status: Never   Smokeless tobacco: Never  Vaping Use   Vaping Use: Former  Substance Use Topics   Alcohol use: Not Currently   Drug use: Never    ROS   Objective:   Vitals: BP 130/82   Pulse (!) 104   Temp 98.2 F (36.8 C)   Resp 18   SpO2 97%   Physical Exam Constitutional:      General: She is not in acute distress.    Appearance: Normal appearance. She is well-developed and normal weight. She is not ill-appearing, toxic-appearing or diaphoretic.  HENT:     Head: Normocephalic and atraumatic.     Right Ear: Tympanic membrane, ear canal and external ear normal. No drainage or tenderness. No middle ear effusion. There is no impacted cerumen. Tympanic membrane is not erythematous.     Left Ear: Tympanic membrane, ear canal and  external ear normal. No drainage or tenderness.  No middle ear effusion. There is no impacted cerumen. Tympanic membrane is not erythematous.     Nose: Congestion present. No rhinorrhea.     Mouth/Throat:     Mouth: Mucous membranes are moist. No oral lesions.     Pharynx: No pharyngeal swelling, oropharyngeal exudate, posterior oropharyngeal erythema or uvula swelling.     Tonsils: No tonsillar exudate or tonsillar abscesses.     Comments: Significant postnasal drainage overlying the pharynx. Eyes:     General: No scleral icterus.       Right eye: No discharge.        Left eye: No discharge.     Extraocular Movements: Extraocular movements intact.     Right eye: Normal extraocular motion.     Left eye: Normal extraocular motion.     Conjunctiva/sclera: Conjunctivae normal.  Cardiovascular:     Rate and Rhythm: Normal rate and regular rhythm.     Heart sounds: Normal heart sounds. No murmur heard.    No friction rub. No gallop.  Pulmonary:     Effort: Pulmonary effort is normal. No respiratory distress.     Breath sounds: No stridor. Wheezing (mild at the tail end of expiration) present. No decreased breath sounds, rhonchi or rales.  Chest:     Chest wall: No tenderness.  Musculoskeletal:     Cervical back: Normal range of motion  and neck supple.  Lymphadenopathy:     Cervical: No cervical adenopathy.  Skin:    General: Skin is warm and dry.  Neurological:     General: No focal deficit present.     Mental Status: She is alert and oriented to person, place, and time.  Psychiatric:        Mood and Affect: Mood normal.        Behavior: Behavior normal.     Assessment and Plan :   PDMP not reviewed this encounter.  1. Sinobronchitis   2. Wheezing   3. Left ear pain   4. Sinus congestion   5. Hoarseness   6. Acute cough   7. Mild intermittent asthma without complication     We will manage for sinobronchitis with an oral prednisone course in the context of her asthma.   Deferred imaging given clear lung sounds otherwise.  Recommended supportive care otherwise including refilling her albuterol inhaler. Counseled patient on potential for adverse effects with medications prescribed/recommended today, ER and return-to-clinic precautions discussed, patient verbalized understanding.    Wallis Bamberg, [place] 07/30/22 1718

## 2022-07-30 NOTE — ED Triage Notes (Signed)
Patient presents to UC for wheezing, nasal congestion, ear pain, hoarseness, and SOB, chest tightness since Monday. Taking allegra, mucinex no improvement.   Denies fever.

## 2022-09-07 IMAGING — CT CT ABD-PELV W/O CM
2 of 4 series · 16 of 46 positions shown, 18 images · non-contrast
Comparison: None.

CLINICAL DATA: Status post recent bilateral tubal ligation with
acute abdominal pain.

EXAM:
CT ABDOMEN AND PELVIS WITHOUT CONTRAST
TECHNIQUE: Multidetector CT imaging of the abdomen and pelvis was performed
following the standard protocol without IV contrast.

[Series 2: routine abd/pel wo · axial · 0.79mm/px · z∈[-527,-102]mm · 13 of 93 slices shown, 15 images]
[im 4/93  soft-tissue]
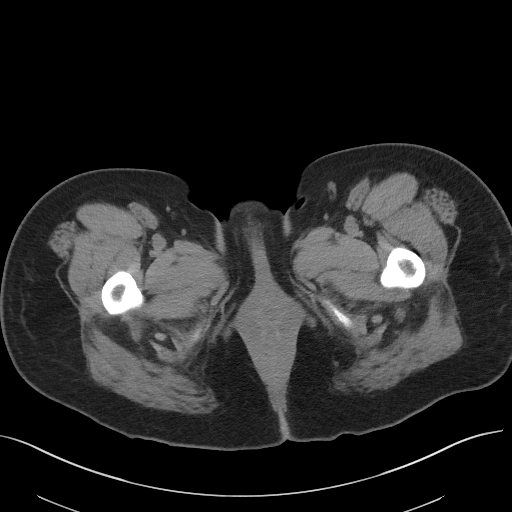
[im 4/93  bone]
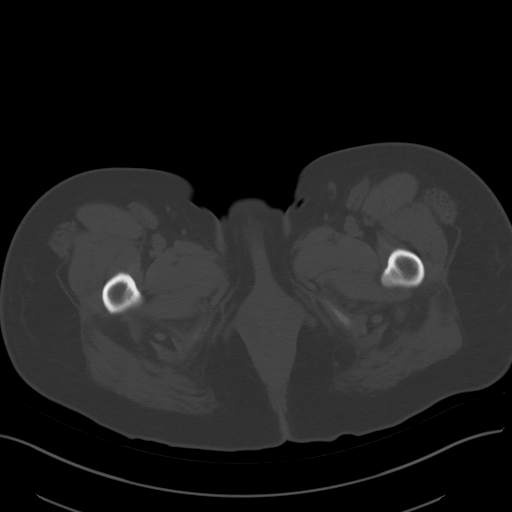
[im 12/93  soft-tissue]
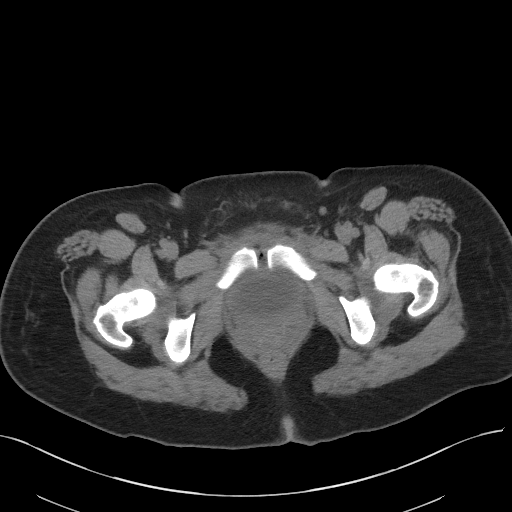
[im 19/93  soft-tissue]
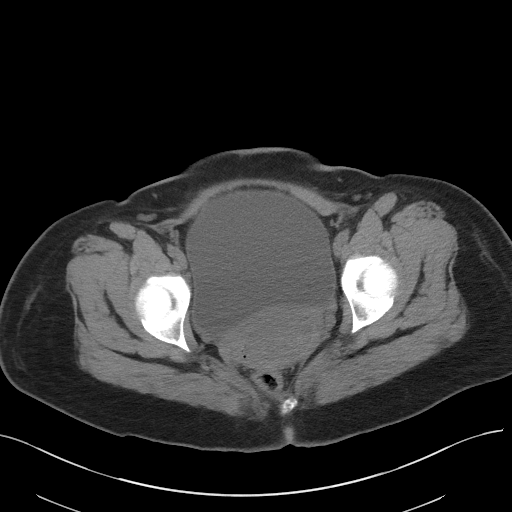
[im 26/93  soft-tissue]
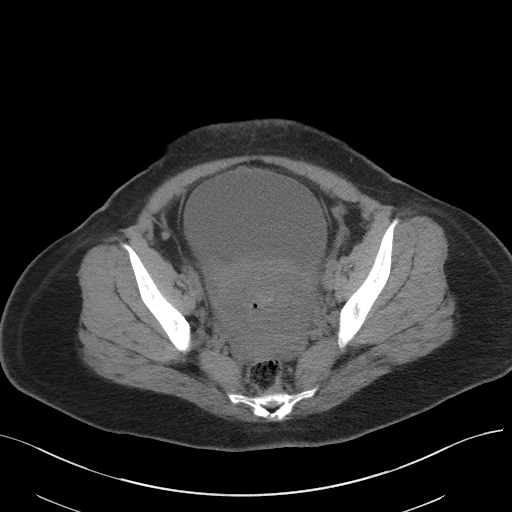
[im 34/93  soft-tissue]
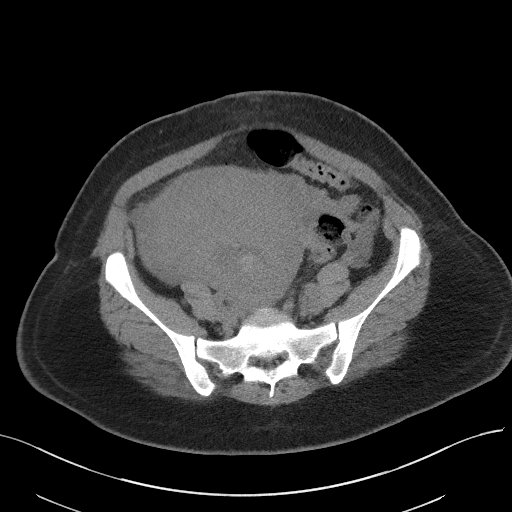
[im 41/93  soft-tissue]
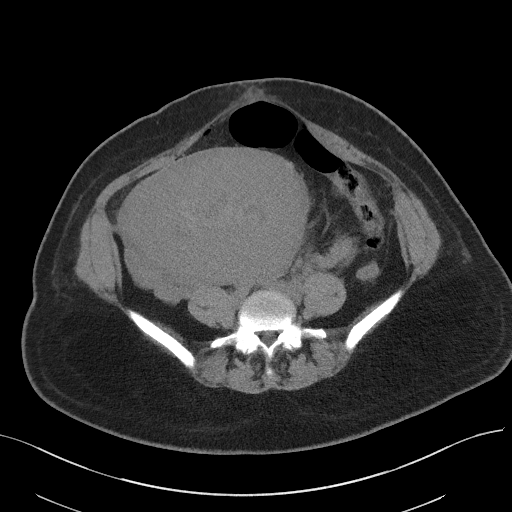
[im 48/93  soft-tissue]
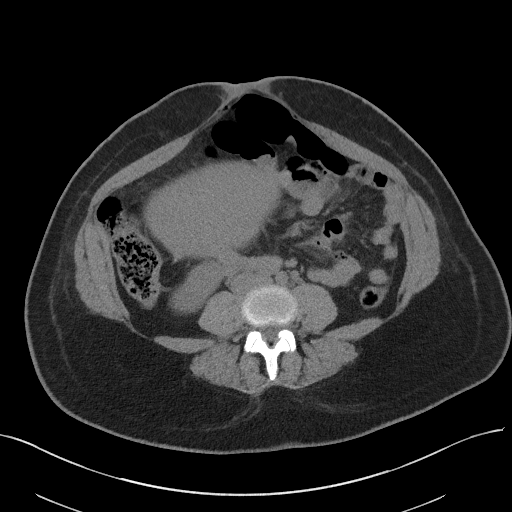
[im 52/93  soft-tissue]
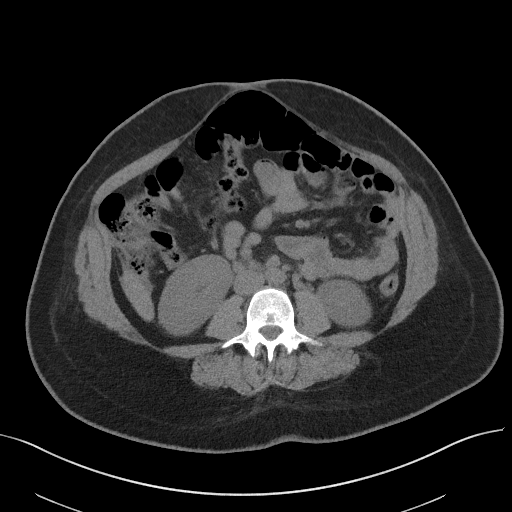
[im 59/93  soft-tissue]
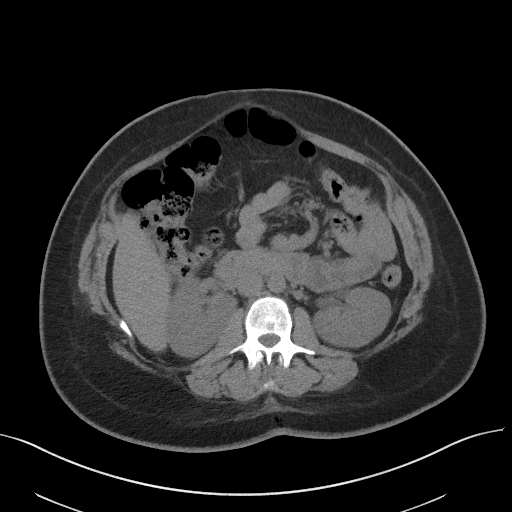
[im 59/93  bone]
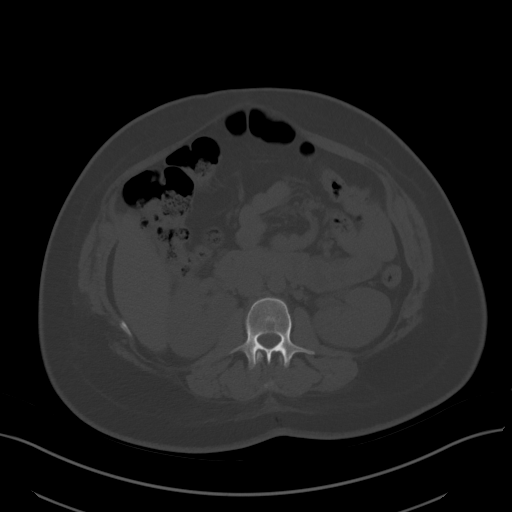
[im 67/93  soft-tissue]
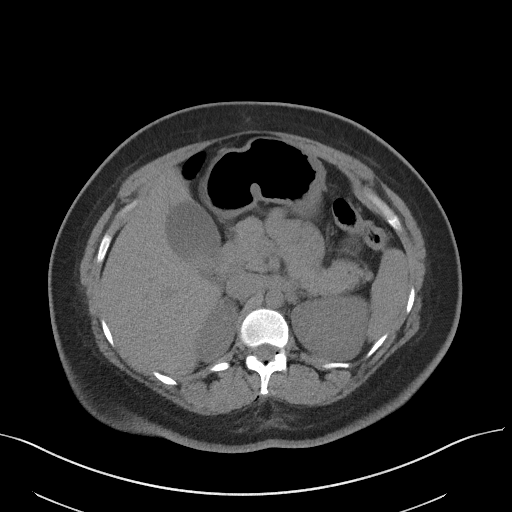
[im 74/93  soft-tissue]
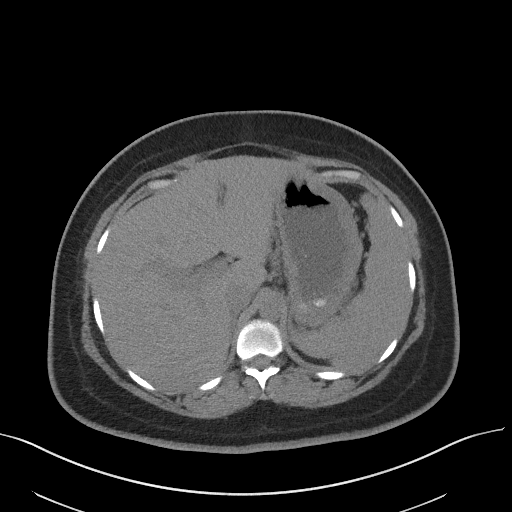
[im 81/93  soft-tissue]
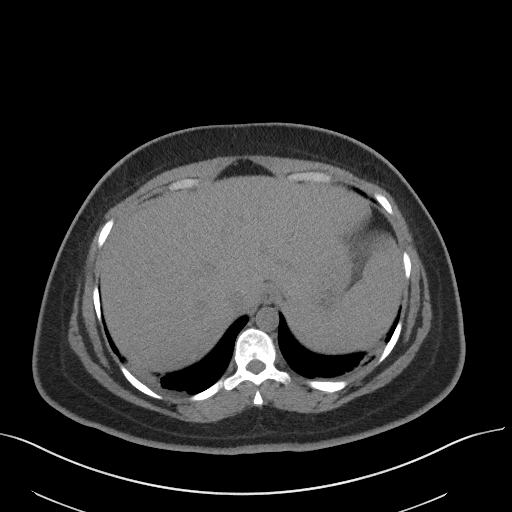
[im 89/93  soft-tissue]
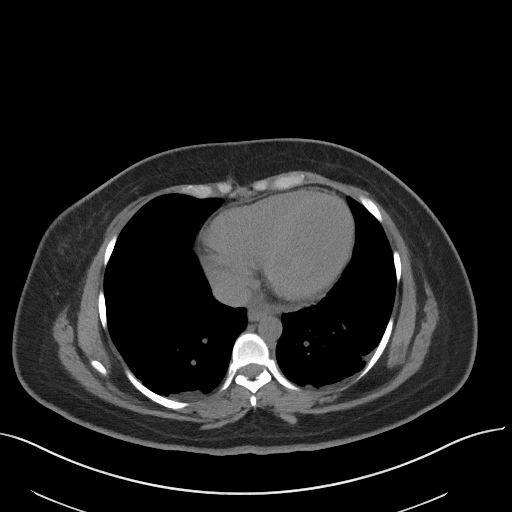

[Series 5: coronal st · coronal · 0.85mm/px · 3 of 101 slices shown]
[im 34/101  soft-tissue]
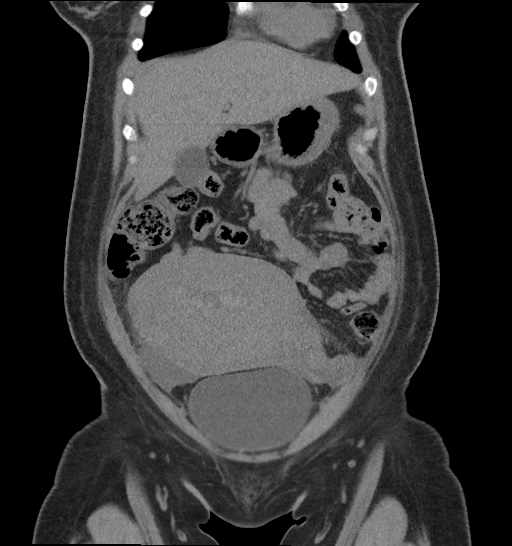
[im 45/101  soft-tissue]
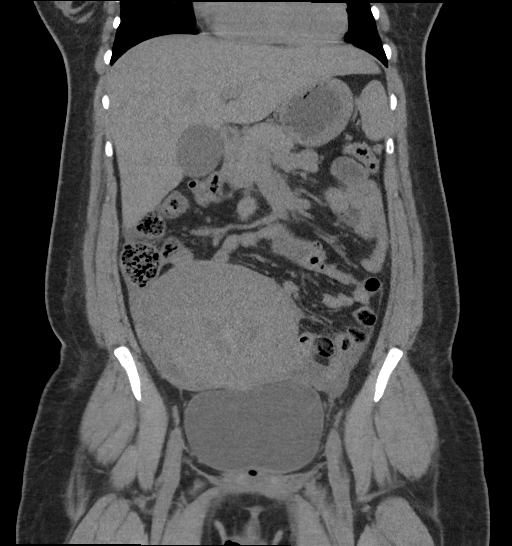
[im 56/101  soft-tissue]
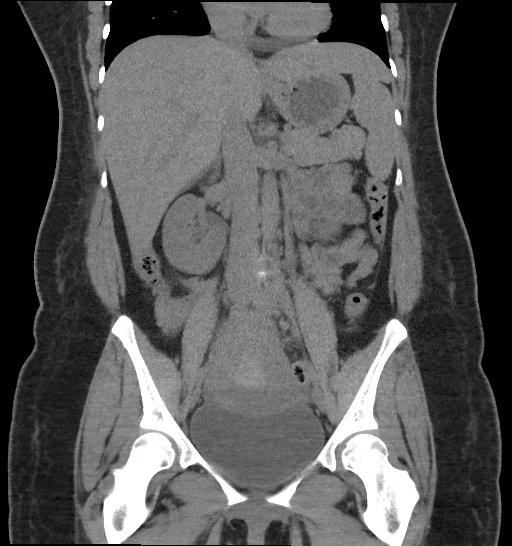

[16 of 46 positions shown; findings below may reference images not displayed]

FINDINGS: Lower chest: Very mild atelectasis is seen within the posterior
aspects of the bilateral lung bases.

Hepatobiliary: No focal liver abnormality is seen. Several
subcentimeter gallstones are seen within the gallbladder lumen
without evidence of gallbladder wall thickening or biliary
dilatation.

Pancreas: Unremarkable. No pancreatic ductal dilatation or
surrounding inflammatory changes.

Spleen: Normal in size without focal abnormality.

Adrenals/Urinary Tract: Adrenal glands are unremarkable. Kidneys are
normal, without renal calculi, focal lesion, or hydronephrosis. A
very small amount of air is seen within the lumen of a moderately
distended urinary bladder.

Stomach/Bowel: Stomach is within normal limits. Appendix appears
normal. No evidence of bowel wall thickening, distention, or
inflammatory changes.

Vascular/Lymphatic: No significant vascular findings are present. No
enlarged abdominal or pelvic lymph nodes.

Reproductive: An enlarged postpartum uterus is seen. A
mild-to-moderate amount of heterogeneous increased attenuation is
present throughout the endometrial canal.

Other: No abdominal wall hernia or abnormality. No abdominopelvic
ascites.

Several small foci of free air are seen within the anterior aspect
of the lower abdomen and pelvis.

Musculoskeletal: No acute or significant osseous findings.
IMPRESSION: 1. Enlarged postpartum uterus with a mild-to-moderate amount of
heterogeneous increased attenuation throughout the endometrial canal
which is suspicious for the presence of blood products.
2. Several small foci of free air within the anterior aspect of the
lower abdomen and pelvis, secondary to the patient's recent surgical
intervention.
3. Cholelithiasis without evidence of acute cholecystitis.

## 2022-11-02 ENCOUNTER — Other Ambulatory Visit: Payer: Self-pay | Admitting: Physician Assistant

## 2022-11-02 DIAGNOSIS — M5416 Radiculopathy, lumbar region: Secondary | ICD-10-CM

## 2022-11-22 ENCOUNTER — Ambulatory Visit
Admission: RE | Admit: 2022-11-22 | Discharge: 2022-11-22 | Disposition: A | Discharge status: Home / Self Care | Payer: Medicaid Other | Source: Ambulatory Visit | Attending: Physician Assistant | Admitting: Physician Assistant

## 2022-11-22 DIAGNOSIS — M5416 Radiculopathy, lumbar region: Secondary | ICD-10-CM

## 2023-03-07 DIAGNOSIS — M461 Sacroiliitis, not elsewhere classified: Secondary | ICD-10-CM | POA: Diagnosis not present

## 2023-03-07 DIAGNOSIS — M5416 Radiculopathy, lumbar region: Secondary | ICD-10-CM | POA: Diagnosis not present

## 2023-03-07 DIAGNOSIS — M5380 Other specified dorsopathies, site unspecified: Secondary | ICD-10-CM | POA: Diagnosis not present

## 2023-03-07 DIAGNOSIS — M5136 Other intervertebral disc degeneration, lumbar region: Secondary | ICD-10-CM | POA: Diagnosis not present

## 2024-05-26 ENCOUNTER — Emergency Department (HOSPITAL_COMMUNITY)
Admission: EM | Admit: 2024-05-26 | Discharge: 2024-05-27 | Disposition: A | Discharge status: Other Acute Inpatient Hospital | Attending: Emergency Medicine | Admitting: Emergency Medicine

## 2024-05-26 DIAGNOSIS — R9431 Abnormal electrocardiogram [ECG] [EKG]: Secondary | ICD-10-CM | POA: Diagnosis not present

## 2024-05-26 DIAGNOSIS — F329 Major depressive disorder, single episode, unspecified: Secondary | ICD-10-CM | POA: Diagnosis not present

## 2024-05-26 DIAGNOSIS — F322 Major depressive disorder, single episode, severe without psychotic features: Secondary | ICD-10-CM | POA: Insufficient documentation

## 2024-05-26 DIAGNOSIS — Z882 Allergy status to sulfonamides status: Secondary | ICD-10-CM | POA: Diagnosis not present

## 2024-05-26 DIAGNOSIS — J45909 Unspecified asthma, uncomplicated: Secondary | ICD-10-CM | POA: Diagnosis not present

## 2024-05-26 DIAGNOSIS — R45851 Suicidal ideations: Secondary | ICD-10-CM | POA: Insufficient documentation

## 2024-05-26 DIAGNOSIS — Z79899 Other long term (current) drug therapy: Secondary | ICD-10-CM | POA: Diagnosis not present

## 2024-05-26 DIAGNOSIS — F332 Major depressive disorder, recurrent severe without psychotic features: Secondary | ICD-10-CM | POA: Diagnosis not present

## 2024-05-26 DIAGNOSIS — Z635 Disruption of family by separation and divorce: Secondary | ICD-10-CM | POA: Diagnosis not present

## 2024-05-26 DIAGNOSIS — F339 Major depressive disorder, recurrent, unspecified: Secondary | ICD-10-CM | POA: Diagnosis not present

## 2024-05-26 DIAGNOSIS — F1011 Alcohol abuse, in remission: Secondary | ICD-10-CM | POA: Diagnosis not present

## 2024-05-26 DIAGNOSIS — F4312 Post-traumatic stress disorder, chronic: Secondary | ICD-10-CM | POA: Diagnosis not present

## 2024-05-26 DIAGNOSIS — Z1152 Encounter for screening for COVID-19: Secondary | ICD-10-CM | POA: Diagnosis not present

## 2024-05-26 DIAGNOSIS — F411 Generalized anxiety disorder: Secondary | ICD-10-CM | POA: Diagnosis not present

## 2024-05-26 DIAGNOSIS — F5104 Psychophysiologic insomnia: Secondary | ICD-10-CM | POA: Diagnosis not present

## 2024-05-26 LAB — COMPREHENSIVE METABOLIC PANEL WITH GFR
ALT: 19 U/L (ref 0–44)
AST: 17 U/L (ref 15–41)
Albumin: 4.1 g/dL (ref 3.5–5.0)
Alkaline Phosphatase: 54 U/L (ref 38–126)
Anion gap: 8 (ref 5–15)
BUN: 9 mg/dL (ref 6–20)
CO2: 22 mmol/L (ref 22–32)
Calcium: 8.7 mg/dL — ABNORMAL LOW (ref 8.9–10.3)
Chloride: 109 mmol/L (ref 98–111)
Creatinine, Ser: 0.61 mg/dL (ref 0.44–1.00)
GFR, Estimated: >60 mL/min (ref >60)
Glucose, Bld: 103 mg/dL — ABNORMAL HIGH (ref 70–99)
Potassium: 3.8 mmol/L (ref 3.5–5.1)
Sodium: 139 mmol/L (ref 135–145)
Total Bilirubin: 0.5 mg/dL (ref 0.0–1.2)
Total Protein: 7.5 g/dL (ref 6.5–8.1)

## 2024-05-26 LAB — CBC
HCT: 38.8 % (ref 36.0–46.0)
Hemoglobin: 13 g/dL (ref 12.0–15.0)
MCH: 28.8 pg (ref 26.0–34.0)
MCHC: 33.5 g/dL (ref 30.0–36.0)
MCV: 85.8 fL (ref 80.0–100.0)
Platelets: 329 10*3/uL (ref 150–400)
RBC: 4.52 MIL/uL (ref 3.87–5.11)
RDW: 13.2 % (ref 11.5–15.5)
WBC: 7.4 10*3/uL (ref 4.0–10.5)
nRBC: 0 % (ref 0.0–0.2)

## 2024-05-26 LAB — HCG, SERUM, QUALITATIVE: Preg, Serum: NEGATIVE

## 2024-05-26 LAB — ETHANOL: Alcohol, Ethyl (B): <15 mg/dL (ref <15)

## 2024-05-26 LAB — ACETAMINOPHEN LEVEL: Acetaminophen (Tylenol), Serum: <10 ug/mL — ABNORMAL LOW (ref 10–30)

## 2024-05-26 LAB — SALICYLATE LEVEL: Salicylate Lvl: <7 mg/dL — ABNORMAL LOW (ref 7.0–30.0)

## 2024-05-26 MED ORDER — ACETAMINOPHEN 325 MG PO TABS
650.0000 mg | ORAL_TABLET | ORAL | Status: DC | PRN
  Administered 2024-05-26: 650 mg via ORAL
  Filled 2024-05-26: qty 2

## 2024-05-26 MED ORDER — LORAZEPAM 1 MG PO TABS
1.0000 mg | ORAL_TABLET | ORAL | Status: DC | PRN
  Administered 2024-05-26: 1 mg via ORAL
  Filled 2024-05-26: qty 1

## 2024-05-26 MED ORDER — ALUM & MAG HYDROXIDE-SIMETH 200-200-20 MG/5ML PO SUSP
30.0000 mL | Freq: Four times a day (QID) | ORAL | Status: DC | PRN
  Administered 2024-05-26: 30 mL via ORAL
  Filled 2024-05-26: qty 30

## 2024-05-26 MED ORDER — ZOLPIDEM TARTRATE 5 MG PO TABS: 5.0000 mg | ORAL_TABLET | Freq: Every evening | ORAL | Status: DC | PRN

## 2024-05-26 MED ORDER — ACETAMINOPHEN 500 MG PO TABS
1000.0000 mg | ORAL_TABLET | Freq: Once | ORAL | Status: AC
  Administered 2024-05-26: 1000 mg via ORAL
  Filled 2024-05-26: qty 2

## 2024-05-26 MED ORDER — ONDANSETRON HCL 4 MG PO TABS
4.0000 mg | ORAL_TABLET | Freq: Three times a day (TID) | ORAL | Status: DC | PRN
  Administered 2024-05-26: 4 mg via ORAL
  Filled 2024-05-26 (×2): qty 1
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
## 2024-05-26 NOTE — ED Notes (Signed)
 IVC in process, envelope [id number]

## 2024-05-26 NOTE — Progress Notes (Signed)
 [facility] LCSW Progress Note   05/26/2024    3:34 PM  Shamiracle Gorden   [id number]   Type of Contact and Topic:  Psychiatric Bed Placement   Pt accepted to [facility]  Patient meets inpatient criteria per [name], NP    The attending provider will be Dr. Delsa  Call report to [phone]    Darryle Lighter, RN @ [facility] ED notified.     Pt scheduled  to arrive at [facility] TODAY @ 2130.    Yacine Droz, MSW, LCSW-A  3:35 PM 05/26/2024

## 2024-05-26 NOTE — Consult Note (Signed)
 [facility] Psychiatric Consult Initial  Patient Name: .Bonnie Williams  MRN: [id number]  DOB: 21-Jan-1991  Consult Order details:  Orders (From admission, onward)     Start     Ordered   05/26/24 1145  CONSULT TO CALL ACT TEAM       Ordering Provider: Nivia Colon, PA-C  Provider:  (Not yet assigned)  Question:  Reason for Consult?  Answer:  Psych consult   05/26/24 1144             Mode of Visit: Tele-visit Virtual Statement:TELE PSYCHIATRY ATTESTATION & CONSENT As the provider for this telehealth consult, I attest that I verified the patient's identity using two separate identifiers, introduced myself to the patient, provided my credentials, disclosed my location, and performed this encounter via a HIPAA-compliant, real-time, face-to-face, two-way, interactive audio and video platform and with the full consent and agreement of the patient (or guardian as applicable.) Patient physical location: [facility]. Telehealth provider physical location: home office in state of [place].   Video start time: 1425 Video end time: 1440    Psychiatry Consult Evaluation  Service Date: May 26, 2024 LOS:  LOS: 0 days  Chief Complaint suicidal  Primary Psychiatric Diagnoses  MDD 2.  GAD 3.    Assessment  Bonnie Williams is a 33 y.o. female admitted: Presented to the EDfor 05/26/2024  9:47 AM for worsening depression/SI. She carries the psychiatric diagnoses of MDD and GAD  Her current presentation  is most consistent with MDD. She meets criteria for IP based on suicidal ideations.  Current outpatient psychotropic medications include kaplyta, buspar, hydroxyzine and historically she has had a okay  response to these medications. She was compliant with medications prior to admission as evidenced by patient report. On initial examination, patient is tearful, depressed, suicidal. Please see plan below for detailed recommendations.   Diagnoses:  Active Hospital problems: Principal Problem:    MDD (major depressive disorder), severe (HCC)    Plan   ## Psychiatric Medication Recommendations:  - will continue home medications and refer any changes to inpatient hospitalization attending  ## Medical Decision Making Capacity: Not specifically addressed in this encounter   ## Disposition:-- We recommend inpatient psychiatric hospitalization when medically cleared. Patient is under voluntary admission status at this time; please IVC if attempts to leave hospital.  ## Behavioral / Environmental: - No specific recommendations at this time.     ## Safety and Observation Level:  - Based on my clinical evaluation, I estimate the patient to be at low risk of self harm in the current setting. - At this time, we recommend  routine. This decision is based on my review of the chart including patient's history and current presentation, interview of the patient, mental status examination, and consideration of suicide risk including evaluating suicidal ideation, plan, intent, suicidal or self-harm behaviors, risk factors, and protective factors. This judgment is based on our ability to directly address suicide risk, implement suicide prevention strategies, and develop a safety plan while the patient is in the clinical setting. Please contact our team if there is a concern that risk level has changed.  CSSR Risk Category:C-SSRS RISK CATEGORY: High Risk  Suicide Risk Assessment: Patient has following modifiable risk factors for suicide: active suicidal ideation and under treated depression , which we are addressing by inpatient admission. Patient has following non-modifiable or demographic risk factors for suicide: separation or divorce Patient has the following protective factors against suicide: Access to outpatient mental health care, Supportive family,  Supportive friends, and Cultural, spiritual, or religious beliefs that discourage suicide  Thank you for this consult request. Recommendations  have been communicated to the primary team.  We will continue to follow at this time.   Bonnie Batter, NP       History of Present Illness  Relevant Aspects of Hospital ED Course:  Admitted on 05/26/2024 pt voluntarily presented herself to ED for worsening depression and suicidal ideations.   Patient Report:  [facility] seen at [facility] ED for psychiatric evaluation.  Patient is tearful, depressed mood and affect, and slightly guarded for assessment.  Patient stated she is always struggled with depression and anxiety and got started on medications a few months ago.  Patient is taking Caplyta, BuSpar, and hydroxyzine.  She feels like they were helping however over the past week she has felt out of body.  Patient states she does not feel like herself, is so depressed she Dahari out of bed, is crying all the time, low self-esteem, has started having thoughts of wanting to herself.  Patient tearful as she describes that she has never wanted to kill herself and has never attempted suicide however she cannot get her suicidal thoughts out of her head currently.  She denies having any plans on how she would do it.  She knows she does not want to kill herself which is why she came to the emergency department.  She denies HI.  Denies AVH.  She does endorse current life stressors such as going through a divorce.  She sees a therapist once a week and does see a provider for outpatient medication management.  Patient feels like she needs further help at this time.  Will recommend inpatient psychiatric treatment at this time.  Patient is currently voluntary however if she attempted to leave she would meet criteria for IVC.  Patient has been accepted to behavioral and will transfer today.   Review of Systems  Psychiatric/Behavioral:  Positive for depression and suicidal ideas. The patient is nervous/anxious.   All other systems reviewed and are negative.    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 rate 18, SpO2 100%, unknown if currently breastfeeding. There is no height or weight on file to calculate BMI.  Physical Exam Vitals and nursing note reviewed.   Neurological:     Mental Status: She is alert and oriented to person, place, and time.     Mental Status Exam: General Appearance: Well Groomed  Orientation:  Full (Time, Place, and Person)  Memory:  Immediate;   Good Recent;   Good  Concentration:  Concentration: Fair  Recall:  Fair  Attention  Fair  Eye Contact:  Good  Speech:  Clear and Coherent  Language:  Good  Volume:  Normal  Mood: depressed  Affect:  Congruent, Flat, and Tearful  Thought Process:  Goal Directed  Thought Content:  WDL  Suicidal Thoughts:  Yes.  without intent/plan  Homicidal Thoughts:  No  Judgement:  Fair  Insight:  Fair  Psychomotor Activity:  Normal  Akathisia:  No  Fund of Knowledge:  Fair      Assets:  Communication Skills Desire for Improvement Housing Leisure Time Physical Health Resilience Social Support  Cognition:  WNL   ADL's:  Intact  AIMS (if indicated):        Other History   These have been pulled in through the EMR, reviewed, and updated if appropriate.  Family History:  The patient's family history is not on file.  Medical History: Past Medical History:  Diagnosis Date   Anemia     Surgical History: Past Surgical History:  Procedure Laterality Date   TUBAL LIGATION Bilateral 10/25/2020   Procedure: POST PARTUM TUBAL LIGATION;  Surgeon: Ward, Mitzie BROCKS, MD;  Location: [facility] ORS;  Service: Gynecology;  Laterality: Bilateral;     Medications:   Current Facility-Administered Medications:    acetaminophen  (TYLENOL ) tablet 650 mg, 650 mg, Oral, Q4H PRN, Tran, Bowie, PA-C   alum & mag hydroxide-simeth (MAALOX/MYLANTA) 200-200-20 MG/5ML suspension 30 mL, 30 mL, Oral, Q6H PRN, Tran, Bowie, PA-C, 30 mL at 05/26/24 1241   ondansetron  (ZOFRAN ) tablet 4 mg, 4 mg, Oral, Q8H PRN, Tran, Bowie, PA-C, 4 mg at 05/26/24 1240   zolpidem (AMBIEN) tablet 5 mg, 5 mg, Oral, QHS PRN, Tran, Bowie, PA-C  Current Outpatient Medications:    albuterol  (VENTOLIN  HFA) 108 (90 Base) MCG/ACT inhaler, Inhale 2 puffs into the lungs every 4 (four) hours as needed for wheezing or shortness of breath., Disp: 18 g, Rfl: 0   cetirizine  (ZYRTEC  ALLERGY) 10 MG tablet, Take 1 tablet (10 mg total) by mouth daily., Disp: 30 tablet, Rfl: 0   EPINEPHrine  0.3 mg/0.3 mL IJ SOAJ injection, Inject 0.3 mg into the muscle as needed for anaphylaxis., Disp: 1 each, Rfl: 1   predniSONE  (DELTASONE ) 50 MG tablet, Take 1 tablet (50 mg total) by mouth daily with breakfast., Disp: 5 tablet, Rfl: 0   promethazine -dextromethorphan (PROMETHAZINE -DM) 6.25-15 MG/5ML syrup, Take 5 mLs by mouth 3 (three) times daily as needed for cough., Disp: 100 mL, Rfl: 0  Allergies: Allergies  Allergen Reactions   Sulfa Antibiotics Swelling and Rash    Bonnie Batter, NP
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
## 2024-05-26 NOTE — ED Notes (Signed)
 Gave report to Psychologist, counselling at [facility].

## 2024-05-26 NOTE — ED Notes (Signed)
 Pt care taken, wants something for he abdominal scar said it was itching. Gave her barrier cream that seems to help. Also wants something for anxiety. Obtained order for ativan  oral.

## 2024-05-26 NOTE — ED Notes (Signed)
 IVC'd 05/26/24, exp 06/02/24; [facility] [place] [facility]

## 2024-05-27 ENCOUNTER — Other Ambulatory Visit: Payer: Self-pay

## 2024-05-27 ENCOUNTER — Encounter (HOSPITAL_COMMUNITY): Payer: Self-pay | Admitting: Nurse Practitioner

## 2024-05-27 ENCOUNTER — Inpatient Hospital Stay (HOSPITAL_COMMUNITY)
Admission: AD | Admit: 2024-05-27 | Discharge: 2024-05-29 | DRG: 885 | Disposition: A | Discharge status: Home / Self Care | Payer: Self-pay | Source: Intra-hospital

## 2024-05-27 DIAGNOSIS — J45909 Unspecified asthma, uncomplicated: Secondary | ICD-10-CM | POA: Diagnosis present

## 2024-05-27 DIAGNOSIS — F1011 Alcohol abuse, in remission: Secondary | ICD-10-CM | POA: Diagnosis present

## 2024-05-27 DIAGNOSIS — Z635 Disruption of family by separation and divorce: Secondary | ICD-10-CM

## 2024-05-27 DIAGNOSIS — Z79899 Other long term (current) drug therapy: Secondary | ICD-10-CM

## 2024-05-27 DIAGNOSIS — F332 Major depressive disorder, recurrent severe without psychotic features: Principal | ICD-10-CM | POA: Diagnosis present

## 2024-05-27 DIAGNOSIS — F411 Generalized anxiety disorder: Secondary | ICD-10-CM | POA: Diagnosis present

## 2024-05-27 DIAGNOSIS — Z1152 Encounter for screening for COVID-19: Secondary | ICD-10-CM | POA: Diagnosis not present

## 2024-05-27 DIAGNOSIS — F4312 Post-traumatic stress disorder, chronic: Secondary | ICD-10-CM | POA: Diagnosis present

## 2024-05-27 DIAGNOSIS — Z882 Allergy status to sulfonamides status: Secondary | ICD-10-CM | POA: Diagnosis not present

## 2024-05-27 DIAGNOSIS — F5104 Psychophysiologic insomnia: Secondary | ICD-10-CM | POA: Diagnosis present

## 2024-05-27 DIAGNOSIS — F329 Major depressive disorder, single episode, unspecified: Secondary | ICD-10-CM | POA: Diagnosis not present

## 2024-05-27 DIAGNOSIS — R45851 Suicidal ideations: Secondary | ICD-10-CM | POA: Diagnosis present

## 2024-05-27 DIAGNOSIS — F339 Major depressive disorder, recurrent, unspecified: Secondary | ICD-10-CM | POA: Diagnosis not present

## 2024-05-27 LAB — RAPID URINE DRUG SCREEN, HOSP PERFORMED
Amphetamines: NOT DETECTED
Barbiturates: NOT DETECTED
Benzodiazepines: NOT DETECTED
Cocaine: NOT DETECTED
Opiates: NOT DETECTED
Tetrahydrocannabinol: NOT DETECTED

## 2024-05-27 MED ORDER — BUSPIRONE HCL 15 MG PO TABS
15.0000 mg | ORAL_TABLET | Freq: Two times a day (BID) | ORAL | Status: DC
  Administered 2024-05-27 – 2024-05-29 (×4): 15 mg via ORAL
  Filled 2024-05-27 (×4): qty 1

## 2024-05-27 MED ORDER — LORAZEPAM 2 MG/ML IJ SOLN: 2.0000 mg | Freq: Three times a day (TID) | INTRAMUSCULAR | Status: DC | PRN

## 2024-05-27 MED ORDER — ADULT MULTIVITAMIN W/MINERALS CH
1.0000 | ORAL_TABLET | Freq: Every day | ORAL | Status: DC
  Administered 2024-05-27 – 2024-05-29 (×3): 1 via ORAL
  Filled 2024-05-27 (×3): qty 1

## 2024-05-27 MED ORDER — ACETAMINOPHEN 325 MG PO TABS
650.0000 mg | ORAL_TABLET | Freq: Four times a day (QID) | ORAL | Status: DC | PRN
Start: 2024-05-27 — End: 2024-05-27

## 2024-05-27 MED ORDER — DIPHENHYDRAMINE HCL 25 MG PO CAPS: 50.0000 mg | ORAL_CAPSULE | Freq: Three times a day (TID) | ORAL | Status: DC | PRN

## 2024-05-27 MED ORDER — ACETAMINOPHEN 325 MG PO TABS
650.0000 mg | ORAL_TABLET | ORAL | Status: DC | PRN
  Administered 2024-05-27: 650 mg via ORAL
  Filled 2024-05-27: qty 2

## 2024-05-27 MED ORDER — SERTRALINE HCL 25 MG PO TABS
25.0000 mg | ORAL_TABLET | Freq: Every day | ORAL | Status: DC
  Administered 2024-05-27 – 2024-05-28 (×2): 25 mg via ORAL
  Filled 2024-05-27 (×2): qty 1

## 2024-05-27 MED ORDER — HALOPERIDOL LACTATE 5 MG/ML IJ SOLN: 10.0000 mg | Freq: Three times a day (TID) | INTRAMUSCULAR | Status: DC | PRN

## 2024-05-27 MED ORDER — EPINEPHRINE 0.3 MG/0.3ML IJ SOAJ: 0.3000 mg | INTRAMUSCULAR | Status: DC | PRN

## 2024-05-27 MED ORDER — HYDROXYZINE HCL 10 MG PO TABS
10.0000 mg | ORAL_TABLET | Freq: Three times a day (TID) | ORAL | Status: DC
  Administered 2024-05-27 – 2024-05-29 (×5): 10 mg via ORAL
  Filled 2024-05-27 (×5): qty 1

## 2024-05-27 MED ORDER — TRIAMCINOLONE ACETONIDE 0.1 % EX CREA: 1.0000 | TOPICAL_CREAM | Freq: Two times a day (BID) | CUTANEOUS | Status: DC | PRN

## 2024-05-27 MED ORDER — LUMATEPERONE TOSYLATE 42 MG PO CAPS: 42.0000 mg | ORAL_CAPSULE | Freq: Every day | ORAL | Status: DC

## 2024-05-27 MED ORDER — HALOPERIDOL 5 MG PO TABS: 5.0000 mg | ORAL_TABLET | Freq: Three times a day (TID) | ORAL | Status: DC | PRN

## 2024-05-27 MED ORDER — HALOPERIDOL LACTATE 5 MG/ML IJ SOLN: 5.0000 mg | Freq: Three times a day (TID) | INTRAMUSCULAR | Status: DC | PRN

## 2024-05-27 MED ORDER — MAGNESIUM HYDROXIDE 400 MG/5ML PO SUSP: 30.0000 mL | Freq: Every day | ORAL | Status: DC | PRN

## 2024-05-27 MED ORDER — ENSURE PLUS HIGH PROTEIN PO LIQD
237.0000 mL | Freq: Two times a day (BID) | ORAL | Status: DC
  Filled 2024-05-27 (×5): qty 237

## 2024-05-27 MED ORDER — DIPHENHYDRAMINE HCL 50 MG/ML IJ SOLN: 50.0000 mg | Freq: Three times a day (TID) | INTRAMUSCULAR | Status: DC | PRN

## 2024-05-27 MED ORDER — ALBUTEROL SULFATE HFA 108 (90 BASE) MCG/ACT IN AERS: 1.0000 | INHALATION_SPRAY | Freq: Four times a day (QID) | RESPIRATORY_TRACT | Status: DC | PRN

## 2024-05-27 MED ORDER — ONDANSETRON HCL 4 MG PO TABS
4.0000 mg | ORAL_TABLET | Freq: Three times a day (TID) | ORAL | Status: DC | PRN
  Administered 2024-05-27: 4 mg via ORAL
  Filled 2024-05-27: qty 1

## 2024-05-27 MED ORDER — VITAMIN E 180 MG (400 UNIT) PO CAPS
400.0000 [IU] | ORAL_CAPSULE | Freq: Every day | ORAL | Status: DC
  Administered 2024-05-29: 400 [IU] via ORAL
  Filled 2024-05-27 (×5): qty 1

## 2024-05-27 MED ORDER — ZOLPIDEM TARTRATE 5 MG PO TABS: 5.0000 mg | ORAL_TABLET | Freq: Every evening | ORAL | Status: DC | PRN

## 2024-05-27 MED ORDER — MELATONIN 3 MG PO TABS
3.0000 mg | ORAL_TABLET | Freq: Every day | ORAL | Status: DC
  Administered 2024-05-27 – 2024-05-28 (×2): 3 mg via ORAL
  Filled 2024-05-27 (×2): qty 1

## 2024-05-27 MED ORDER — ALUM & MAG HYDROXIDE-SIMETH 200-200-20 MG/5ML PO SUSP: 30.0000 mL | Freq: Four times a day (QID) | ORAL | Status: DC | PRN

## 2024-05-27 MED ORDER — FERROUS SULFATE 325 (65 FE) MG PO TBEC
325.0000 mg | DELAYED_RELEASE_TABLET | Freq: Every day | ORAL | Status: DC
  Filled 2024-05-27: qty 1

## 2024-05-27 NOTE — Progress Notes (Signed)
   05/27/24 2000  Psych Admission Type (Psych Patients Only)  Admission Status Involuntary  Psychosocial Assessment  Patient Complaints Anxiety;Depression  Eye Contact Fair  Facial Expression Anxious  Affect Anxious;Sad  Speech Logical/coherent  Interaction Assertive  Motor Activity Fidgety  Appearance/Hygiene Unremarkable  Behavior Characteristics Cooperative  Mood Anxious  Thought Process  Coherency WDL  Content Blaming self  Delusions None reported or observed  Perception WDL  Hallucination None reported or observed  Judgment Impaired  Confusion None  Danger to Self  Current suicidal ideation? Denies  Self-Injurious Behavior No self-injurious ideation or behavior indicators observed or expressed   Agreement Not to Harm Self Yes  Description of Agreement verbal  Danger to Others  Danger to Others None reported or observed
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
## 2024-05-27 NOTE — BHH Group Notes (Signed)
 BHH Group Notes:  (Nursing/MHT/Case Management/Adjunct)  Date:  05/27/2024  Time:  2:12 PM  Type of Therapy:  Psychoeducational Skills  Participation Level:  Active  Participation Quality:  Appropriate  Affect:  , SAD  Cognitive:  Appropriate  Insight:  Appropriate  Engagement in Group:  Limited, withdrawn  Modes of Intervention:  Discussion, Education, and Exploration  Summary of Progress/Problems: pt were given education on positive reframing as a tool for helping negative thoughts and mental health.

## 2024-05-27 NOTE — Progress Notes (Signed)
 Adult Psychoeducational Group Note  Date:  05/27/2024 Time:  11:18 AM  Group Topic/Focus:  Goals Group:   The focus of this group is to help patients establish daily goals to achieve during treatment and discuss how the patient can incorporate goal setting into their daily lives to aide in recovery.  Participation Level:  Active  Participation Quality:  Appropriate  Affect:  Appropriate  Cognitive:  Appropriate  Insight: Appropriate  Engagement in Group:  Engaged  Modes of Intervention:  Discussion  Additional Comments:  Pt stated she is feeling better.  Pt goal for the day is to find coping skills and to get back on medication.  Daine Pillar D 05/27/2024, 11:18 AM

## 2024-05-27 NOTE — Progress Notes (Signed)
 Pt arrived 0059 via police escort.Pt is IVC 33 year old female. Pt was anxious and  tearful during assessment questions. Pt denied HI and AVH. Pt endorses SI , but denied having a plan of intention. Admission plan of care reviewed with pt, and consent forms signed.  Personal belongings/skin assessment completed.   No contraband found, and Skin was found to be intact. Pt has an surgical scar on her lower abdomin and moles on her lower back. Patient oriented to the unit, and unit rules.  Routine safety checks initiated.  Verbalizes understanding of unit rules/protocols.   Patient is presently safe on the unit. No unsafe behaviors noted. Q 15 minute safety checks maintained per unit protocol.

## 2024-05-27 NOTE — ED Notes (Signed)
 Patient belongings were checked by patient and given to officer to transport to [facility].

## 2024-05-27 NOTE — Plan of Care (Signed)
  Problem: Education: Goal: Knowledge of [place] General Education information/materials will improve Outcome: Progressing   Problem: Physical Regulation: Goal: Ability to maintain clinical measurements within normal limits will improve Outcome: Progressing   Problem: Safety: Goal: Periods of time without injury will increase Outcome: Progressing
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
## 2024-05-27 NOTE — ED Notes (Signed)
 Called to get update on transport and was advised that transport is in route.

## 2024-05-27 NOTE — Progress Notes (Signed)
   05/27/24 1300  Psych Admission Type (Psych Patients Only)  Admission Status Involuntary  Psychosocial Assessment  Patient Complaints Anxiety;Depression  Eye Contact Fair  Facial Expression Anxious  Affect Anxious;Sad  Speech Logical/coherent  Interaction Assertive  Motor Activity Fidgety  Appearance/Hygiene Unremarkable  Behavior Characteristics Cooperative  Mood Anxious  Thought Process  Coherency WDL  Content Blaming self  Delusions None reported or observed  Perception WDL  Hallucination None reported or observed  Judgment Impaired  Confusion None  Danger to Self  Current suicidal ideation? Denies  Self-Injurious Behavior No self-injurious ideation or behavior indicators observed or expressed   Agreement Not to Harm Self Yes  Description of Agreement Verbal  Danger to Others  Danger to Others None reported or observed

## 2024-05-27 NOTE — Plan of Care (Signed)
  Problem: Education: Goal: Knowledge of [place] [facility] Education information/materials will improve Outcome: Progressing   Problem: Activity: Goal: Interest or engagement in activities will improve Outcome: Progressing Goal: Sleeping patterns will improve Outcome: Progressing   Problem: Coping: Goal: Ability to verbalize frustrations and anger appropriately will improve Outcome: Progressing Goal: Ability to demonstrate self-control will improve Outcome: Progressing   Problem: Physical Regulation: Goal: Ability to maintain clinical measurements within normal limits will improve Outcome: Progressing   Problem: Safety: Goal: Periods of time without injury will increase Outcome: Progressing
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
## 2024-05-28 ENCOUNTER — Encounter (HOSPITAL_COMMUNITY): Payer: Self-pay

## 2024-05-28 DIAGNOSIS — F339 Major depressive disorder, recurrent, unspecified: Secondary | ICD-10-CM

## 2024-05-28 LAB — LIPID PANEL
Cholesterol: 167 mg/dL (ref 0–200)
HDL: 42 mg/dL (ref >40)
LDL Cholesterol: 112 mg/dL — ABNORMAL HIGH (ref 0–99)
Total CHOL/HDL Ratio: 4 ratio
Triglycerides: 65 mg/dL (ref <150)
VLDL: 13 mg/dL (ref 0–40)

## 2024-05-28 LAB — HEMOGLOBIN A1C
Hgb A1c MFr Bld: 5.4 % (ref 4.8–5.6)
Mean Plasma Glucose: 108.28 mg/dL

## 2024-05-28 LAB — TSH: TSH: 0.81 u[IU]/mL (ref 0.350–4.500)

## 2024-05-28 MED ORDER — FERROUS SULFATE 325 (65 FE) MG PO TABS
325.0000 mg | ORAL_TABLET | Freq: Every day | ORAL | Status: DC
  Administered 2024-05-28 – 2024-05-29 (×2): 325 mg via ORAL
  Filled 2024-05-28 (×2): qty 1

## 2024-05-28 MED ORDER — SERTRALINE HCL 50 MG PO TABS
50.0000 mg | ORAL_TABLET | Freq: Every day | ORAL | Status: DC
  Administered 2024-05-29: 50 mg via ORAL
  Filled 2024-05-28: qty 1

## 2024-05-28 NOTE — Progress Notes (Signed)
 [facility] MD Progress Note  05/28/2024 4:09 PM Bonnie Williams  MRN:  [id number] Subjective:  Chart reviewed. No significant events overnight. Patinet reported sleeping reasonably well last night. She has been visible and active in the milieu and has been compliant with her prescribed medications. She was assessed today during treatment team and shared the events leading to this admission, which in summation were a combination of multiple psychosocial stressors, such as a divorce. She reports that prior to the admission she was experiencing thoughts of not wanting to be here anymore but denies active suicidal thoughts or plan. She reports that since admission she has started to feel better. She denied any significant side effects from sertraline or buspirone. She reports that she is already established with a therapist through [place] [facility] and that her next appointment is tomorrow afternoon at 3pm.   Principal Problem: MDD (major depressive disorder) Diagnosis: Principal Problem:   MDD (major depressive disorder)  Total Time spent with patient: 20 minutes   Current Medications: Current Facility-Administered Medications  Medication Dose Route Frequency Provider Last Rate Last Admin   acetaminophen  (TYLENOL ) tablet 650 mg  650 mg Oral Q4H PRN Mardy Legacy, NP   650 mg at 05/27/24 0830   albuterol  (VENTOLIN  HFA) 108 (90 Base) MCG/ACT inhaler 1-2 puff  1-2 puff Inhalation Q6H PRN Zouev, Dmitri, MD       alum & mag hydroxide-simeth (MAALOX/MYLANTA) 200-200-20 MG/5ML suspension 30 mL  30 mL Oral Q6H PRN Mardy Legacy, NP       busPIRone (BUSPAR) tablet 15 mg  15 mg Oral BID Zouev, Dmitri, MD   15 mg at 05/28/24 0837   haloperidol (HALDOL) tablet 5 mg  5 mg Oral TID PRN Mardy Legacy, NP       And   diphenhydrAMINE  (BENADRYL ) capsule 50 mg  50 mg Oral TID PRN Mardy Legacy, NP       haloperidol lactate (HALDOL) injection 5 mg  5 mg Intramuscular TID PRN Mardy Legacy, NP        And   diphenhydrAMINE  (BENADRYL ) injection 50 mg  50 mg Intramuscular TID PRN Mardy Legacy, NP       And   LORazepam  (ATIVAN ) injection 2 mg  2 mg Intramuscular TID PRN Mardy Legacy, NP       haloperidol lactate (HALDOL) injection 10 mg  10 mg Intramuscular TID PRN Mardy Legacy, NP       And   diphenhydrAMINE  (BENADRYL ) injection 50 mg  50 mg Intramuscular TID PRN Mardy Legacy, NP       And   LORazepam  (ATIVAN ) injection 2 mg  2 mg Intramuscular TID PRN Mardy Legacy, NP       EPINEPHrine  (EPI-PEN) injection 0.3 mg  0.3 mg Intramuscular PRN Zouev, Dmitri, MD       ferrous sulfate  tablet 325 mg  325 mg Oral Q breakfast Zouev, Dmitri, MD   325 mg at 05/28/24 0837   hydrOXYzine (ATARAX) tablet 10 mg  10 mg Oral TID Zouev, Dmitri, MD   10 mg at 05/28/24 1138   magnesium hydroxide (MILK OF MAGNESIA) suspension 30 mL  30 mL Oral Daily PRN Mardy Legacy, NP       melatonin tablet 3 mg  3 mg Oral QHS Zouev, Dmitri, MD   3 mg at 05/27/24 2108   multivitamin with minerals tablet 1 tablet  1 tablet Oral Daily Zouev, Dmitri, MD   1 tablet at 05/28/24 0837   ondansetron  (ZOFRAN ) tablet 4 mg  4  mg Oral Q8H PRN Mardy Legacy, NP   4 mg at 05/27/24 0831   sertraline (ZOLOFT) tablet 25 mg  25 mg Oral Daily Zouev, Dmitri, MD   25 mg at 05/28/24 0837   triamcinolone cream (KENALOG) 0.1 % cream 1 Application  1 Application Topical BID PRN Zouev, Dmitri, MD       vitamin E capsule 400 Units  400 Units Oral Daily Zouev, Dmitri, MD        Lab Results:  Results for orders placed or performed during the hospital encounter of 05/27/24 (from the past 48 hours)  Rapid urine drug screen (hospital performed)     Status: None   Collection Time: 05/27/24  6:45 PM  Result Value Ref Range   Opiates NONE DETECTED NONE DETECTED   Cocaine NONE DETECTED NONE DETECTED   Benzodiazepines NONE DETECTED NONE DETECTED   Amphetamines NONE DETECTED NONE DETECTED   Tetrahydrocannabinol NONE DETECTED NONE  DETECTED   Barbiturates NONE DETECTED NONE DETECTED    Comment: (NOTE) DRUG SCREEN FOR MEDICAL PURPOSES ONLY.  IF CONFIRMATION IS NEEDED FOR ANY PURPOSE, NOTIFY LAB WITHIN 5 DAYS.  LOWEST DETECTABLE LIMITS FOR URINE DRUG SCREEN Drug Class                     Cutoff (ng/mL) Amphetamine and metabolites    1000 Barbiturate and metabolites    200 Benzodiazepine                 200 Opiates and metabolites        300 Cocaine and metabolites        300 THC                            50 Performed at [facility], [address] [place], [place] [address]   TSH     Status: None   Collection Time: 05/28/24  6:17 AM  Result Value Ref Range   TSH 0.810 0.350 - 4.500 uIU/mL    Comment: Performed by a 3rd Generation assay with a functional sensitivity of <=0.01 uIU/mL. Performed at [facility], [address] [place], [place] [address]   Lipid panel     Status: Abnormal   Collection Time: 05/28/24  6:17 AM  Result Value Ref Range   Cholesterol 167 0 - 200 mg/dL   Triglycerides 65 <849 mg/dL   HDL 42 >59 mg/dL   Total CHOL/HDL Ratio 4.0 RATIO   VLDL 13 0 - 40 mg/dL   LDL Cholesterol 887 (H) 0 - 99 mg/dL    Comment:        Total Cholesterol/HDL:CHD Risk Coronary Heart Disease Risk Table                     Men   Women  1/2 Average Risk   3.4   3.3  Average Risk       5.0   4.4  2 X Average Risk   9.6   7.1  3 X Average Risk  23.4   11.0        Use the calculated Patient Ratio above and the CHD Risk Table to determine the patient's CHD Risk.        ATP III CLASSIFICATION (LDL):  <100     mg/dL   Optimal  899-870  mg/dL   Near or Above  Optimal  130-159  mg/dL   Borderline  839-810  mg/dL   High  >809     mg/dL   Very High Performed at [facility], [address] [place], [place] [address]   Hemoglobin A1c     Status: None   Collection Time: 05/28/24  6:17 AM  Result Value Ref Range   Hgb A1c  MFr Bld 5.4 4.8 - 5.6 %    Comment: (NOTE) Diagnosis of Diabetes The following HbA1c ranges recommended by the American Diabetes Association (ADA) may be used as an aid in the diagnosis of diabetes mellitus.  Hemoglobin             Suggested A1C NGSP%              Diagnosis  <5.7                   Non Diabetic  5.7-6.4                Pre-Diabetic  >6.4                   Diabetic  <7.0                   Glycemic control for                       adults with diabetes.     Mean Plasma Glucose 108.28 mg/dL    Comment: Performed at [facility], [address] [place], [place] [address]    Blood Alcohol level:  Lab Results  Component Value Date   [facility] <15 05/26/2024    Metabolic Disorder Labs: Lab Results  Component Value Date   HGBA1C 5.4 05/28/2024   MPG 108.28 05/28/2024   MPG 128.37 10/24/2020   No results found for: PROLACTIN Lab Results  Component Value Date   CHOL 167 05/28/2024   TRIG 65 05/28/2024   HDL 42 05/28/2024   CHOLHDL 4.0 05/28/2024   VLDL 13 05/28/2024   LDLCALC 112 (H) 05/28/2024       Physical Exam: Physical Exam Vitals and nursing note reviewed.   Musculoskeletal: Normal gait and station  Mental Status Exam: Appearance - casually dressed, appropriate hygiene, NAD Attitude - Calm, polite Mood - okay Affect - Restricted, but reactive Thought Process - LLGD Thought Content - No delusional TC expressed SI/HI - Denies Perceptions - Denies AVH; not RIS Judgement/Insight - Fair to good Fund of knowledge - WNL Language - No impairments   ROS Blood pressure 116/81, pulse 79, temperature 98.1 F (36.7 C), temperature source Oral, resp. rate 15, height 4' 11 (1.499 m), weight 64.4 kg, last menstrual period 05/20/2024, SpO2 99%, unknown if currently breastfeeding. Body mass index is 28.68 kg/m.   Assessment and Plan:  Ms. Bonnie Williams is a 33 y/o female with a history of longstanding depression and anxiety  consistent with MDD and GAD who was admitted involuntarily on 05/27/24 for expressed suicidal ideation without clear plan or intent related to psychosocial stressors.  Today she appeared to credibly deny any active SI.   # Major Depressive Disorder, recurrent, unspecified / Suicidal ideation - Continue sertraline, will increase to 50 mg daily tomorrow - Continue buspirone 15 mg BID  # Generalized Anxiety Disorder - See MDD plan  # Disposition - Patient currently on IVC hold. However, it is not clear that there has been any clearly expressed or documented evidence  of imminent danger to self or others. Anticipate discharge tomorrow or Wednesday.  Oliva DELENA Salmon, MD 05/28/2024, 4:10 PM

## 2024-05-28 NOTE — Group Note (Signed)
 Date:  05/28/2024 Time:  9:25 PM  Group Topic/Focus:  Wrap-Up Group:   The focus of this group is to help patients review their daily goal of treatment and discuss progress on daily workbooks.    Additional Comments:   Pt attended the AA meeting this evening.   Rosella DELENA Pouch 05/28/2024, 9:25 PM

## 2024-05-28 NOTE — Progress Notes (Signed)
   05/28/24 0837  Psych Admission Type (Psych Patients Only)  Admission Status Involuntary  Psychosocial Assessment  Patient Complaints None  Eye Contact Fair  Facial Expression Animated  Affect Appropriate to circumstance  Speech Logical/coherent  Interaction Assertive  Motor Activity Other (Comment) (WDL)  Appearance/Hygiene Unremarkable  Behavior Characteristics Cooperative;Appropriate to situation  Mood Pleasant  Thought Process  Coherency WDL  Content WDL  Delusions None reported or observed  Perception WDL  Hallucination None reported or observed  Judgment Poor  Confusion None  Danger to Self  Current suicidal ideation? Denies  Agreement Not to Harm Self Yes  Description of Agreement Verbal  Danger to Others  Danger to Others None reported or observed

## 2024-05-28 NOTE — Plan of Care (Signed)
   Problem: Education: Goal: Knowledge of [facility] Education information/materials will improve Outcome: Progressing Goal: Emotional status will improve Outcome: Progressing Goal: Mental status will improve Outcome: Progressing Goal: Verbalization of understanding the information provided will improve Outcome: Progressing

## 2024-05-28 NOTE — BH IP Treatment Plan (Signed)
 Interdisciplinary Treatment and Diagnostic Plan Update  05/28/2024 Time of Session: 10:51AM Bonnie Williams MRN: [id number]  Principal Diagnosis: MDD (major depressive disorder)  Secondary Diagnoses: Principal Problem:   MDD (major depressive disorder)   Current Medications:  Current Facility-Administered Medications  Medication Dose Route Frequency Provider Last Rate Last Admin   acetaminophen  (TYLENOL ) tablet 650 mg  650 mg Oral Q4H PRN Mardy Legacy, NP   650 mg at 05/27/24 0830   albuterol  (VENTOLIN  HFA) 108 (90 Base) MCG/ACT inhaler 1-2 puff  1-2 puff Inhalation Q6H PRN Zouev, Dmitri, MD       alum & mag hydroxide-simeth (MAALOX/MYLANTA) 200-200-20 MG/5ML suspension 30 mL  30 mL Oral Q6H PRN Mardy Legacy, NP       busPIRone (BUSPAR) tablet 15 mg  15 mg Oral BID Zouev, Dmitri, MD   15 mg at 05/28/24 0837   haloperidol (HALDOL) tablet 5 mg  5 mg Oral TID PRN Mardy Legacy, NP       And   diphenhydrAMINE  (BENADRYL ) capsule 50 mg  50 mg Oral TID PRN Mardy Legacy, NP       haloperidol lactate (HALDOL) injection 5 mg  5 mg Intramuscular TID PRN Mardy Legacy, NP       And   diphenhydrAMINE  (BENADRYL ) injection 50 mg  50 mg Intramuscular TID PRN Mardy Legacy, NP       And   LORazepam  (ATIVAN ) injection 2 mg  2 mg Intramuscular TID PRN Mardy Legacy, NP       haloperidol lactate (HALDOL) injection 10 mg  10 mg Intramuscular TID PRN Mardy Legacy, NP       And   diphenhydrAMINE  (BENADRYL ) injection 50 mg  50 mg Intramuscular TID PRN Mardy Legacy, NP       And   LORazepam  (ATIVAN ) injection 2 mg  2 mg Intramuscular TID PRN Mardy Legacy, NP       EPINEPHrine  (EPI-PEN) injection 0.3 mg  0.3 mg Intramuscular PRN Zouev, Dmitri, MD       ferrous sulfate  tablet 325 mg  325 mg Oral Q breakfast Zouev, Dmitri, MD   325 mg at 05/28/24 0837   hydrOXYzine (ATARAX) tablet 10 mg  10 mg Oral TID Zouev, Dmitri, MD   10 mg at 05/28/24 1138   magnesium hydroxide  (MILK OF MAGNESIA) suspension 30 mL  30 mL Oral Daily PRN Mardy Legacy, NP       melatonin tablet 3 mg  3 mg Oral QHS Zouev, Dmitri, MD   3 mg at 05/27/24 2108   multivitamin with minerals tablet 1 tablet  1 tablet Oral Daily Zouev, Dmitri, MD   1 tablet at 05/28/24 9162   ondansetron  (ZOFRAN ) tablet 4 mg  4 mg Oral Q8H PRN Mardy Legacy, NP   4 mg at 05/27/24 0831   [START ON 05/29/2024] sertraline (ZOLOFT) tablet 50 mg  50 mg Oral Daily Prentis Oliva LABOR, MD       triamcinolone cream (KENALOG) 0.1 % cream 1 Application  1 Application Topical BID PRN Zouev, Dmitri, MD       vitamin E capsule 400 Units  400 Units Oral Daily Zouev, Dmitri, MD       PTA Medications: Medications Prior to Admission  Medication Sig Dispense Refill Last Dose/Taking   albuterol  (VENTOLIN  HFA) 108 (90 Base) MCG/ACT inhaler Inhale 1-2 puffs into the lungs every 6 (six) hours as needed for wheezing or shortness of breath.   Past Month   ascorbic acid (VITAMIN C) 500 MG  tablet Take 500 mg by mouth daily.   Taking   busPIRone (BUSPAR) 15 MG tablet Take 15 mg by mouth 2 (two) times daily.   Taking   EPINEPHrine  0.3 mg/0.3 mL IJ SOAJ injection Inject 0.3 mg into the muscle as needed for anaphylaxis. 1 each 1 Taking As Needed   ferrous sulfate  325 (65 FE) MG EC tablet Take 325 mg by mouth daily with breakfast.   Taking   hydrOXYzine (ATARAX) 10 MG tablet Take 10 mg by mouth 3 (three) times daily.   Taking   Multiple Vitamin (MULTIVITAMIN WITH MINERALS) TABS tablet Take 1 tablet by mouth daily.   Taking   naproxen sodium (ALEVE) 220 MG tablet Take 220 mg by mouth 2 (two) times daily as needed (pain).   Taking As Needed   triamcinolone cream (KENALOG) 0.1 % Apply 1 Application topically 2 (two) times daily as needed (skin irritation).   Taking As Needed   vitamin E 180 MG (400 UNITS) capsule Take 400 Units by mouth daily.   Taking    Patient Stressors:    Patient Strengths:    Treatment Modalities: Medication  Management, Group therapy, Case management,  1 to 1 session with clinician, Psychoeducation, Recreational therapy.   Physician Treatment Plan for Primary Diagnosis: MDD (major depressive disorder) Long Term Goal(s): Improvement in symptoms so as ready for discharge   Short Term Goals: Ability to identify changes in lifestyle to reduce recurrence of condition will improve Ability to verbalize feelings will improve Ability to disclose and discuss suicidal ideas Ability to demonstrate self-control will improve Ability to identify and develop effective coping behaviors will improve Ability to maintain clinical measurements within normal limits will improve Compliance with prescribed medications will improve Ability to identify triggers associated with substance abuse/mental health issues will improve  Medication Management: Evaluate patient's response, side effects, and tolerance of medication regimen.  Therapeutic Interventions: 1 to 1 sessions, Unit Group sessions and Medication administration.  Evaluation of Outcomes: Progressing  Physician Treatment Plan for Secondary Diagnosis: Principal Problem:   MDD (major depressive disorder)  Long Term Goal(s): Improvement in symptoms so as ready for discharge   Short Term Goals: Ability to identify changes in lifestyle to reduce recurrence of condition will improve Ability to verbalize feelings will improve Ability to disclose and discuss suicidal ideas Ability to demonstrate self-control will improve Ability to identify and develop effective coping behaviors will improve Ability to maintain clinical measurements within normal limits will improve Compliance with prescribed medications will improve Ability to identify triggers associated with substance abuse/mental health issues will improve     Medication Management: Evaluate patient's response, side effects, and tolerance of medication regimen.  Therapeutic Interventions: 1 to 1  sessions, Unit Group sessions and Medication administration.  Evaluation of Outcomes: Progressing   RN Treatment Plan for Primary Diagnosis: MDD (major depressive disorder) Long Term Goal(s): Knowledge of disease and therapeutic regimen to maintain health will improve  Short Term Goals: Ability to remain free from injury will improve, Ability to verbalize frustration and anger appropriately will improve, Ability to disclose and discuss suicidal ideas, Ability to identify and develop effective coping behaviors will improve, and Compliance with prescribed medications will improve  Medication Management: RN will administer medications as ordered by provider, will assess and evaluate patient's response and provide education to patient for prescribed medication. RN will report any adverse and/or side effects to prescribing provider.  Therapeutic Interventions: 1 on 1 counseling sessions, Psychoeducation, Medication administration, Evaluate responses to treatment, Monitor  vital signs and CBGs as ordered, Perform/monitor CIWA, COWS, AIMS and Fall Risk screenings as ordered, Perform wound care treatments as ordered.  Evaluation of Outcomes: Progressing   LCSW Treatment Plan for Primary Diagnosis: MDD (major depressive disorder) Long Term Goal(s): Safe transition to appropriate next level of care at discharge, Engage patient in therapeutic group addressing interpersonal concerns.  Short Term Goals: Engage patient in aftercare planning with referrals and resources, Increase social support, Increase ability to appropriately verbalize feelings, Increase emotional regulation, Facilitate acceptance of mental health diagnosis and concerns, Facilitate patient progression through stages of change regarding substance use diagnoses and concerns, and Increase skills for wellness and recovery  Therapeutic Interventions: Assess for all discharge needs, 1 to 1 time with Social worker, Explore available resources and  support systems, Assess for adequacy in community support network, Educate family and significant other(s) on suicide prevention, Complete Psychosocial Assessment, Interpersonal group therapy.  Evaluation of Outcomes: Progressing   Progress in Treatment: Attending groups: Yes. Participating in groups: Yes. Taking medication as prescribed: Yes. Toleration medication: Yes. Family/Significant other contact made: No, will contact:   Aloysius Dollar (friend [phone] Patient understands diagnosis: Yes. Discussing patient identified problems/goals with staff: Yes. Medical problems stabilized or resolved: Yes. Denies suicidal/homicidal ideation: Yes. Issues/concerns per patient self-inventory: No. Other: n/a  New problem(s) identified: No, Describe:  none  New Short Term/Long Term Goal(s): medication stabilization, elimination of SI thoughts, development of comprehensive mental wellness plan.   Patient Goals: Look at my meds, look at something different  Discharge Plan or Barriers: Patient recently admitted. CSW will continue to follow and assess for appropriate referrals and possible discharge planning.    Reason for Continuation of Hospitalization: Anxiety Depression Medication stabilization  Estimated Length of Stay: 1-3 days  Last 3 [place] Suicide Severity Risk Score: Flowsheet Row [facility] (Current) [facility]26[facility]2024 [facility] from 07/30/2022 in [facility] RISK CATEGORY Low Risk High Risk No Risk    Last PHQ 2/9 Scores:     No data to display          Scribe for Treatment Team: Harvir Patry N Arthur Speagle, LCSW 05/28/2024 4:22 PM

## 2024-05-28 NOTE — Plan of Care (Signed)
   Problem: Education: Goal: Emotional status will improve Outcome: Progressing Goal: Mental status will improve Outcome: Progressing Goal: Verbalization of understanding the information provided will improve Outcome: Progressing

## 2024-05-28 NOTE — Group Note (Signed)
 Date:  05/28/2024 Time:  0830  Group Topic/Focus:  Goals Group:   The focus of this group is to help patients establish daily goals to achieve during treatment and discuss how the patient can incorporate goal setting into their daily lives to aide in recovery.    Participation Level:  Active  Participation Quality:  Appropriate  Affect:  Appropriate  Cognitive:  Alert and Appropriate  Insight: Appropriate and Good  Engagement in Group:  Engaged  Modes of Intervention:  Discussion  Additional Comments:  Pt stated her goal were to speak with someone about discharge.  Cassius LOISE Dawn 05/28/2024, 12:03 PM

## 2024-05-28 NOTE — Group Note (Unsigned)
 Date:  05/28/2024 Time:  9:16 AM  Group Topic/Focus:  Goals Group:   The focus of this group is to help patients establish daily goals to achieve during treatment and discuss how the patient can incorporate goal setting into their daily lives to aide in recovery.     Participation Level:  {BHH PARTICIPATION OZCZO:77735}  Participation Quality:  {BHH PARTICIPATION QUALITY:22265}  Affect:  {BHH AFFECT:22266}  Cognitive:  {BHH COGNITIVE:22267}  Insight: {BHH Insight2:20797}  Engagement in Group:  {BHH ENGAGEMENT IN HMNLE:77731}  Modes of Intervention:  {BHH MODES OF INTERVENTION:22269}  Additional Comments:  ***  Bonnie Williams 05/28/2024, 9:16 AM

## 2024-05-28 NOTE — Group Note (Signed)
 Recreation Therapy Group Note   Group Topic:Problem Solving  Group Date: 05/28/2024 Start Time: 0930 End Time: 1005 Facilitators: Donielle Radziewicz-McCall, LRT,CTRS Location: [address] Dayroom   Group Topic: Communication, Team Building, Problem Solving  Goal Area(s) Addresses:  Patient will effectively work with peer towards shared goal.  Patient will identify skills used to make activity successful.  Patient will share challenges and verbalize solution-driven approaches used. Patient will identify how skills used during activity can be used to reach post d/c goals.   Behavioral Response: Active  Intervention: STEM Activity   Activity: [employer]. Patients were provided the following materials: 5 drinking straws, 5 rubber bands, 5 paper clips, 2 index cards, and 2 drinking cups. Using the provided materials patients were asked to build a launching mechanism to launch a ping pong ball across the room, approximately 10 feet. Patients were divided into teams of 3-5. Instructions required all materials be incorporated into the device, functionality of items left to the peer group's discretion.  Education: Pharmacist, community, Scientist, physiological, Air cabin crew, Building control surveyor.   Education Outcome: Acknowledges education/In group clarification offered/Needs additional education.    Affect/Mood: Appropriate   Participation Level: Active   Participation Quality: Independent   Behavior: Appropriate   Speech/Thought Process: Focused   Insight: Good   Judgement: Good   Modes of Intervention: Problem-solving   Patient Response to Interventions:  Engaged   Education Outcome:  In group clarification offered    Clinical Observations/Individualized Feedback: Pt attended and participated in activity.     Plan: Continue to engage patient in RT group sessions 2-3x/week.   Jailynne Opperman-McCall, LRT,CTRS 05/28/2024 12:58 PM

## 2024-05-29 MED ORDER — SERTRALINE HCL 50 MG PO TABS: 50.0000 mg | ORAL_TABLET | Freq: Every day | ORAL | 0 refills | Status: AC

## 2024-05-29 NOTE — Group Note (Addendum)
 Date:  05/29/2024 Time:  11:47 AM  Group Topic/Focus:  Goals Group:   The focus of this group is to help patients establish daily goals to achieve during treatment and discuss how the patient can incorporate goal setting into their daily lives to aide in recovery.    Participation Level:  Active  Participation Quality:  Appropriate and Attentive  Affect:  Appropriate  Cognitive:  Alert and Appropriate  Insight: Good  Engagement in Group:  Engaged  Modes of Intervention:  Exploration   Additional Comments:  Pt participated in Goals Group. Pt stated her goal is to discharge today. Pt identified not overloading herself with work and to realize that she is important also.      Genesis Novosad 05/29/2024, 11:47 AM

## 2024-05-29 NOTE — [facility] Suicide Risk Assessment (Addendum)
 [facility] INPATIENT:  Family/Significant Other Suicide Prevention Education  Suicide Prevention Education:  Education Completed; Aloysius Dollar (female friend) [phone],  (name of family member/significant other) has been identified by the patient as the family member/significant other with whom the patient will be residing, and identified as the person(s) who will aid the patient in the event of a mental health crisis (suicidal ideations/suicide attempt).  With written consent from the patient, the family member/significant other has been provided the following suicide prevention education, prior to the and/or following the discharge of the patient.  Friend said that patient doesn't have any guns or weapons to her knowledge.    When asked if she has any concerns about patient being discharged today, she said she doesn't have any concerns If everything has been corrected.  She said that she spoke with patient on the phone on Sunday (today is Tuesday) and she sounded better.  The following people live in the home:  patient and her 2 girls.  She said that the girls stay in the home every other week (the girls go to their dad's home every other week).    The suicide prevention education provided includes the following: Suicide risk factors Suicide prevention and interventions National Suicide Hotline telephone number [facility] assessment telephone number [facility] Emergency Assistance 911 [facility] and/or Residential Mobile Crisis Unit telephone number  Request made of family/significant other to: Remove weapons (e.g., guns, rifles, knives), all items previously/currently identified as safety concern.    The family member/significant other verbalizes understanding of the suicide prevention education information provided.  The family member/significant other agrees to remove the items of safety concern listed above.  Bonnie Williams O Bonnie Williams, LCSWA 05/29/2024, 10:02 AM

## 2024-05-29 NOTE — Transportation (Signed)
 05/29/2024  Bonnie Williams DOB: 19-Jul-1991 MRN: [id number]   RIDER WAIVER AND RELEASE OF LIABILITY  For the purposes of helping with transportation needs, [facility] partners with outside transportation providers (taxi companies, [place], Catering manager.) to give [facility] patients or other approved people the choice of on-demand rides Public librarian) to our buildings for non-emergency visits.  By using [employer], I, the person signing this document, on behalf of myself and/or any legal minors (in my care using the [employer]), agree:  Science writer given to me are supplied by independent, outside transportation providers who do not work for, or have any affiliation with, [employer]. [employer] is not a transportation company. [facility] has no control over the quality or safety of the rides I get using [employer]. [facility] has no control over whether any outside ride will happen on time or not. [facility] gives no guarantee on the reliability, quality, safety, or availability on any rides, or that no mistakes will happen. I know and accept that traveling by vehicle (car, truck, SVU, fleeta, bus, taxi, etc.) has risks of serious injuries such as disability, being paralyzed, and death. I know and agree the risk of using [employer] is mine alone, and not [employer]. [employer] are provided as is and as are available. The transportation providers are in charge for all inspections and care of the vehicles used to provide these rides. I agree not to take legal action against [facility], its agents, employees, officers, directors, representatives, insurers, attorneys, assigns, successors, subsidiaries, and affiliates at any time for any reasons related directly or indirectly to using [employer]. I also agree not to take legal action against [facility] or its affiliates for any injury, death, or damage to property caused by or related to  using [employer]. I have read this Waiver and Release of Liability, and I understand the terms used in it and their legal meaning. This Waiver is freely and voluntarily given with the understanding that my right (or any legal minors) to legal action against  relating to [employer] is knowingly given up to use these services.   I attest that I read the Ride Waiver and Release of Liability to Bonnie Williams, gave [name] the opportunity to ask questions and answered the questions asked (if any). I affirm that Bonnie Williams then provided consent for assistance with transportation.     Jdyn Parkerson, LCSWA 05/29/2024

## 2024-05-29 NOTE — Progress Notes (Signed)
 Pt discharged home alert and oriented x 4, calm and cooperative. All belongings returned to pt. Discharge instructions reviewed with pt and pt verbalized understanding of discharge instructions.

## 2024-05-29 NOTE — Progress Notes (Signed)
   05/29/24 0800  Psych Admission Type (Psych Patients Only)  Admission Status Involuntary  Psychosocial Assessment  Patient Complaints None  Eye Contact Fair  Facial Expression Animated  Affect Appropriate to circumstance  Speech Logical/coherent  Interaction Assertive  Motor Activity Other (Comment) (WDL)  Appearance/Hygiene Unremarkable  Behavior Characteristics Cooperative  Mood Pleasant  Thought Process  Coherency WDL  Content WDL  Delusions None reported or observed  Perception WDL  Hallucination None reported or observed  Judgment WDL  Confusion None  Danger to Self  Current suicidal ideation? Denies  Self-Injurious Behavior No self-injurious ideation or behavior indicators observed or expressed   Agreement Not to Harm Self Yes  Description of Agreement verbal  Danger to Others  Danger to Others None reported or observed

## 2024-05-29 NOTE — Progress Notes (Addendum)
  [facility] Adult Case Management Discharge Plan :  Will you be returning to the same living situation after discharge:  Yes,  patient lives in her apartment  At discharge, do you have transportation home?: Yes,  CSW arranged transportation through [employer] for 11:15 AM.  Patient said her car is at [employer]. Do you have the ability to pay for your medications: Yes,  patient has insurance  Release of information consent forms completed and in the chart;  Patient's signature needed at discharge.  Patient to Follow up at:  Follow-up Information     [facility] Follow up on 06/06/2024.   Why: You have a hospital follow up appointment for therapy and medication management services on 06/06/24 at 9:00 am.  This will be a Virtual telehealth appointment. Contact information: [address]  [address] [place] [address] [phone]                 Next level of care provider has access to [facility] Link:no  Safety Planning and Suicide Prevention discussed: Yes,  Aloysius Dollar (female friend) [phone] and with patient  Has patient been referred to the Quitline?: Patient does not use tobacco/nicotine products  Patient has been referred for addiction treatment: No known substance use disorder.  At admission, patient tested negative for all substances.  Patient denied any substance use.  She said she was drinking alcohol occasionally, and her last drink was in December 2024.   Lachlyn Vanderstelt O Maddelyn Rocca, LCSWA 05/29/2024, 10:09 AM
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
## 2024-05-29 NOTE — Progress Notes (Signed)
   05/29/24 0200  Psych Admission Type (Psych Patients Only)  Admission Status Involuntary  Psychosocial Assessment  Eye Contact Fair  Facial Expression Anxious  Affect Anxious;Sad  Speech Logical/coherent  Interaction Assertive  Motor Activity Fidgety  Appearance/Hygiene Unremarkable  Behavior Characteristics Cooperative;Appropriate to situation  Mood Pleasant  Thought Process  Coherency WDL  Content Blaming self  Delusions None reported or observed  Perception WDL  Hallucination None reported or observed  Judgment Impaired  Confusion None  Danger to Self  Current suicidal ideation? Denies  Self-Injurious Behavior No self-injurious ideation or behavior indicators observed or expressed   Agreement Not to Harm Self Yes  Description of Agreement verbal  Danger to Others  Danger to Others None reported or observed

## 2024-05-29 NOTE — [facility] Suicide Risk Assessment (Signed)
 [facility] INPATIENT:  Family/Significant Other Suicide Prevention Education  Suicide Prevention Education:  Education Completed; with patient,  (name of family member/significant other) has been identified by the patient as the family member/significant other with whom the patient will be residing, and identified as the person(s) who will aid the patient in the event of a mental health crisis (suicidal ideations/suicide attempt).  With written consent from the patient, the family member/significant other has been provided the following suicide prevention education, prior to the and/or following the discharge of the patient.  Patient was given Suicide Prevention Information brochure.  Patient said she doesn't have any guns or weapons.  She  said medications and sharp objects (such as knives and scissors) have been secured because she has 2 children:  32 year old and 11 year old.  She said her children are with her soon-to-be ex-husband, and the children will return to her home today.  The suicide prevention education provided includes the following: Suicide risk factors Suicide prevention and interventions National Suicide Hotline telephone number [facility] assessment telephone number [facility] Emergency Assistance 911 [facility] and/or Residential Mobile Crisis Unit telephone number  Request made of family/significant other to: Remove weapons (e.g., guns, rifles, knives), all items previously/currently identified as safety concern.   Remove drugs/medications (over-the-counter, prescriptions, illicit drugs), all items previously/currently identified as a safety concern.  The family member/significant other verbalizes understanding of the suicide prevention education information provided.  The family member/significant other agrees to remove the items of safety concern listed above.  Tawonda Legaspi O Leatha Rohner, LCSWA 05/29/2024, 9:50 AM

## 2024-05-30 NOTE — Discharge Summary (Signed)
 Physician Discharge Summary Note  Patient:  Bonnie Williams is an 33 y.o., female MRN:  [id number] DOB:  08-12-1991 Patient phone:  [phone] (home)  Patient address:   [address] [place] [address],  Total Time spent with patient: 45 minutes  Date of Admission:  05/27/2024 Date of Discharge: 05/29/2024  Reason for Admission:  The patient was admitted for worsening depression and expressed suicidal ideation in the context of multiple psychosocial stressors. Please reference the H&P by Dr. Zouev dated 05/27/2024 for further information.  Principal Problem: MDD (major depressive disorder) Discharge Diagnoses: Principal Problem:   MDD (major depressive disorder)   Hospital Course:  The patient was admitted to inpatient psychiatry involuntarily due to concerns for suicidal ideation. PTA she had been prescribed Caplyta, buspirone, and hydroxyzine. Caplyta was discontinued due to only experiencing a partial treatment response and she was started on sertraline for symptoms of MDD and GAD. While she was emotional, anxious, and tearful on HD 1 by HD 2 she reported feeling markedly better, her affect restricted but non-labile and she reported feeling significantly better after sleeping well the night prior. She reported that she felt stable and ready to return home. She explained that her comments were born out of stress and feeling overwhelmed and that she has not experienced any actual suicidal ideation. She was tolerating her prescribed medications and was amenable to further increasing sertraline to 50 mg daily. By HD 3 she continued to demonstrate a stable affect and had been dying suicidal ideation for 48 hours. She was visible in the milieu, participated in treatment and discharge planning, and was appropriate with other patients and staff. She was already established with a therapist and next scheduled appointment was the afternoon of discharge. During the admission the  patient consistently demonstrated safe behaviors and did not require any PRN medications for unsafe or agitated behaviors. No significant medical issues arose or were addressed during this admission.   Musculoskeletal: Normal gait and station  Assets  Assets: Desire for Improvement; Communication Skills; Financial Resources/Insurance; Social Support   Sleep  Sleep:No data recorded Estimated Sleeping Duration (Last 24 Hours): 0.00 hours   Physical Exam: Physical Exam Vitals and nursing note reviewed.  Mental Status Exam: Appearance - Casually dressed, NAD Attitude - Polite, calm Speech - normal volume, prosody, inflection Mood - Good Affect - Full Thought Process - LLGD Thought Content - No delusional TC expressed SI/HI - Denies Perceptions - Denies AVH; not RIS Judgement/Insight - Good Fund of knowledge - WNL Language - No impairments  ROS Blood pressure 111/80, pulse 69, temperature 98.1 F (36.7 C), temperature source Oral, resp. rate 14, height 4' 11 (1.499 m), weight 64.4 kg, last menstrual period 05/20/2024, SpO2 100%, unknown if currently breastfeeding. Body mass index is 28.68 kg/m.   Social History   Tobacco Use  Smoking Status Never  Smokeless Tobacco Never   Tobacco Cessation:  N/A, patient does not currently use tobacco products   Blood Alcohol level:  Lab Results  Component Value Date   [facility] <15 05/26/2024    Metabolic Disorder Labs:  Lab Results  Component Value Date   HGBA1C 5.4 05/28/2024   MPG 108.28 05/28/2024   MPG 128.37 10/24/2020   No results found for: PROLACTIN Lab Results  Component Value Date   CHOL 167 05/28/2024   TRIG 65 05/28/2024   HDL 42 05/28/2024   CHOLHDL 4.0 05/28/2024   VLDL 13 05/28/2024   LDLCALC 112 (H) 05/28/2024  See Psychiatric Specialty Exam and Suicide Risk Assessment completed by Attending Physician prior to discharge.  Discharge destination:  Home  Is patient on multiple antipsychotic  therapies at discharge:  No  Allergies as of 05/29/2024       Reactions   Sulfa Antibiotics Swelling, Rash        Medication List     STOP taking these medications    hydrOXYzine 10 MG tablet Commonly known as: ATARAX       TAKE these medications      Indication  albuterol  108 (90 Base) MCG/ACT inhaler Commonly known as: VENTOLIN  HFA Inhale 1-2 puffs into the lungs every 6 (six) hours as needed for wheezing or shortness of breath.  Indication: Asthma   ascorbic acid 500 MG tablet Commonly known as: VITAMIN C Take 500 mg by mouth daily.  Indication: Inadequate Vitamin C   busPIRone 15 MG tablet Commonly known as: BUSPAR Take 15 mg by mouth 2 (two) times daily.  Indication: Anxiety Disorder   EPINEPHrine  0.3 mg/0.3 mL Soaj injection Commonly known as: EPI-PEN Inject 0.3 mg into the muscle as needed for anaphylaxis.  Indication: Life-Threatening Hypersensitivity Reaction   ferrous sulfate  325 (65 FE) MG EC tablet Take 325 mg by mouth daily with breakfast.  Indication: Iron Deficiency   multivitamin with minerals Tabs tablet Take 1 tablet by mouth daily.  Indication: Diet   naproxen sodium 220 MG tablet Commonly known as: ALEVE Take 220 mg by mouth 2 (two) times daily as needed (pain).  Indication: Pain   sertraline 50 MG tablet Commonly known as: ZOLOFT Take 1 tablet (50 mg total) by mouth daily.  Indication: Major Depressive Disorder   triamcinolone cream 0.1 % Commonly known as: KENALOG Apply 1 Application topically 2 (two) times daily as needed (skin irritation).  Indication: Atopic Dermatitis   vitamin E 180 MG (400 UNITS) capsule Take 400 Units by mouth daily.  Indication: Deficiency of Vitamin E        Follow-up Information     [facility] Follow up on 06/06/2024.   Why: You have a hospital follow up appointment for therapy and medication management services on 06/06/24 at 9:00 am.  This will be a Virtual telehealth appointment. Contact  information: [address]  [address] [place] [address] [phone]                 Follow-up recommendations:  Take all medications as prescribed. Attend all scheduled follow-up appointments.     Signed: Oliva DELENA Salmon, DO 05/30/2024, 3:51 PM
